# Patient Record
Sex: Female | Born: 1995 | Hispanic: No | Marital: Single | Smoking: Former smoker
Health system: Southern US, Community
[De-identification: ages and names within clinical notes are randomized; demographics above are authoritative.]

## PROBLEM LIST (undated history)

## (undated) ENCOUNTER — Inpatient Hospital Stay: Payer: Self-pay

## (undated) DIAGNOSIS — F32A Depression, unspecified: Secondary | ICD-10-CM

## (undated) DIAGNOSIS — F419 Anxiety disorder, unspecified: Secondary | ICD-10-CM

## (undated) DIAGNOSIS — F329 Major depressive disorder, single episode, unspecified: Secondary | ICD-10-CM

## (undated) DIAGNOSIS — G4730 Sleep apnea, unspecified: Secondary | ICD-10-CM

## (undated) DIAGNOSIS — O24419 Gestational diabetes mellitus in pregnancy, unspecified control: Secondary | ICD-10-CM

## (undated) HISTORY — PX: TONSILLECTOMY: SUR1361

---
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
## 2014-07-12 ENCOUNTER — Emergency Department: Payer: Self-pay | Admitting: Emergency Medicine

## 2014-07-12 LAB — URINALYSIS, COMPLETE
BACTERIA: NONE SEEN
Bilirubin,UR: NEGATIVE
Blood: NEGATIVE
Glucose,UR: NEGATIVE mg/dL (ref 0–75)
Ketone: NEGATIVE
Nitrite: NEGATIVE
PROTEIN: NEGATIVE
Ph: 8 (ref 4.5–8.0)
RBC,UR: 5 /HPF (ref 0–5)
SPECIFIC GRAVITY: 1.023 (ref 1.003–1.030)
WBC UR: 10 /HPF (ref 0–5)

## 2014-07-12 LAB — GC/CHLAMYDIA PROBE AMP

## 2014-07-12 LAB — WET PREP, GENITAL

## 2015-08-04 ENCOUNTER — Emergency Department
Admission: EM | Admit: 2015-08-04 | Discharge: 2015-08-04 | Disposition: A | Discharge status: Home / Self Care | Payer: Medicaid Other | Attending: Emergency Medicine | Admitting: Emergency Medicine

## 2015-08-04 DIAGNOSIS — G8929 Other chronic pain: Secondary | ICD-10-CM | POA: Insufficient documentation

## 2015-08-04 DIAGNOSIS — M545 Low back pain: Secondary | ICD-10-CM | POA: Diagnosis present

## 2015-08-04 DIAGNOSIS — M62830 Muscle spasm of back: Secondary | ICD-10-CM | POA: Insufficient documentation

## 2015-08-04 MED ORDER — METHOCARBAMOL 500 MG PO TABS: 500.0000 mg | ORAL_TABLET | Freq: Four times a day (QID) | ORAL | Status: DC

## 2015-08-04 MED ORDER — MELOXICAM 15 MG PO TABS: 15.0000 mg | ORAL_TABLET | Freq: Every day | ORAL | Status: DC

## 2015-08-04 NOTE — ED Notes (Signed)
Pt c/o lower back pain off and on for several months, worse in the past couple of days

## 2015-08-04 NOTE — Discharge Instructions (Signed)
Back Exercises °The following exercises strengthen the muscles that help to support the back. They also help to keep the lower back flexible. Doing these exercises can help to prevent back pain or lessen existing pain. °If you have back pain or discomfort, try doing these exercises 2-3 times each day or as told by your health care provider. When the pain goes away, do them once each day, but increase the number of times that you repeat the steps for each exercise (do more repetitions). If you do not have back pain or discomfort, do these exercises once each day or as told by your health care provider. °EXERCISES °Single Knee to Chest °Repeat these steps 3-5 times for each leg: °· Lie on your back on a firm bed or the floor with your legs extended. °· Bring one knee to your chest. Your other leg should stay extended and in contact with the floor. °· Hold your knee in place by grabbing your knee or thigh. °· Pull on your knee until you feel a gentle stretch in your lower back. °· Hold the stretch for 10-30 seconds. °· Slowly release and straighten your leg. °Pelvic Tilt °Repeat these steps 5-10 times: °· Lie on your back on a firm bed or the floor with your legs extended. °· Bend your knees so they are pointing toward the ceiling and your feet are flat on the floor. °· Tighten your lower abdominal muscles to press your lower back against the floor. This motion will tilt your pelvis so your tailbone points up toward the ceiling instead of pointing to your feet or the floor. °· With gentle tension and even breathing, hold this position for 5-10 seconds. °Cat-Cow °Repeat these steps until your lower back becomes more flexible: °· Get into a hands-and-knees position on a firm surface. Keep your hands under your shoulders, and keep your knees under your hips. You may place padding under your knees for comfort. °· Let your head hang down, and point your tailbone toward the floor so your lower back becomes rounded like the  back of a cat. °· Hold this position for 5 seconds. °· Slowly lift your head and point your tailbone up toward the ceiling so your back forms a sagging arch like the back of a cow. °· Hold this position for 5 seconds. °Press-Ups °Repeat these steps 5-10 times: °· Lie on your abdomen (face-down) on the floor. °· Place your palms near your head, about shoulder-width apart. °· While you keep your back as relaxed as possible and keep your hips on the floor, slowly straighten your arms to raise the top half of your body and lift your shoulders. Do not use your back muscles to raise your upper torso. You may adjust the placement of your hands to make yourself more comfortable. °· Hold this position for 5 seconds while you keep your back relaxed. °· Slowly return to lying flat on the floor. °Bridges °Repeat these steps 10 times: °· Lie on your back on a firm surface. °· Bend your knees so they are pointing toward the ceiling and your feet are flat on the floor. °· Tighten your buttocks muscles and lift your buttocks off of the floor until your waist is at almost the same height as your knees. You should feel the muscles working in your buttocks and the back of your thighs. If you do not feel these muscles, slide your feet 1-2 inches farther away from your buttocks. °· Hold this position for 3-5   seconds. °· Slowly lower your hips to the starting position, and allow your buttocks muscles to relax completely. °If this exercise is too easy, try doing it with your arms crossed over your chest. °Abdominal Crunches °Repeat these steps 5-10 times: °1. Lie on your back on a firm bed or the floor with your legs extended. °2. Bend your knees so they are pointing toward the ceiling and your feet are flat on the floor. °3. Cross your arms over your chest. °4. Tip your chin slightly toward your chest without bending your neck. °5. Tighten your abdominal muscles and slowly raise your trunk (torso) high enough to lift your shoulder blades  a tiny bit off of the floor. Avoid raising your torso higher than that, because it can put too much stress on your low back and it does not help to strengthen your abdominal muscles. °6. Slowly return to your starting position. °Back Lifts °Repeat these steps 5-10 times: °1. Lie on your abdomen (face-down) with your arms at your sides, and rest your forehead on the floor. °2. Tighten the muscles in your legs and your buttocks. °3. Slowly lift your chest off of the floor while you keep your hips pressed to the floor. Keep the back of your head in line with the curve in your back. Your eyes should be looking at the floor. °4. Hold this position for 3-5 seconds. °5. Slowly return to your starting position. °SEEK MEDICAL CARE IF: °· Your back pain or discomfort gets much worse when you do an exercise. °· Your back pain or discomfort does not lessen within 2 hours after you exercise. °If you have any of these problems, stop doing these exercises right away. Do not do them again unless your health care provider says that you can. °SEEK IMMEDIATE MEDICAL CARE IF: °· You develop sudden, severe back pain. If this happens, stop doing the exercises right away. Do not do them again unless your health care provider says that you can. °  °This information is not intended to replace advice given to you by your health care provider. Make sure you discuss any questions you have with your health care provider. °  °Document Released: 08/30/2004 Document Revised: 04/13/2015 Document Reviewed: 09/16/2014 °Elsevier Interactive Patient Education ©2016 [employer] ° °Heat Therapy °Heat therapy can help ease sore, stiff, injured, and tight muscles and joints. Heat relaxes your muscles, which may help ease your pain.  °RISKS AND COMPLICATIONS °If you have any of the following conditions, do not use heat therapy unless your health care provider has approved: °· Poor circulation. °· Healing wounds or scarred skin in the area being  treated. °· Diabetes, heart disease, or high blood pressure. °· Not being able to feel (numbness) the area being treated. °· Unusual swelling of the area being treated. °· Active infections. °· Blood clots. °· Cancer. °· Inability to communicate pain. This may include young children and people who have problems with their brain function (dementia). °· Pregnancy. °Heat therapy should only be used on old, pre-existing, or long-lasting (chronic) injuries. Do not use heat therapy on new injuries unless directed by your health care provider. °HOW TO USE HEAT THERAPY °There are several different kinds of heat therapy, including: °· Moist heat pack. °· Warm water bath. °· Hot water bottle. °· Electric heating pad. °· Heated gel pack. °· Heated wrap. °· Electric heating pad. °Use the heat therapy method suggested by your health care provider. Follow your health care provider's instructions on when   and how to use heat therapy. °GENERAL HEAT THERAPY RECOMMENDATIONS °· Do not sleep while using heat therapy. Only use heat therapy while you are awake. °· Your skin may turn pink while using heat therapy. Do not use heat therapy if your skin turns red. °· Do not use heat therapy if you have new pain. °· High heat or long exposure to heat can cause burns. Be careful when using heat therapy to avoid burning your skin. °· Do not use heat therapy on areas of your skin that are already irritated, such as with a rash or sunburn. °SEEK MEDICAL CARE IF: °· You have blisters, redness, swelling, or numbness. °· You have new pain. °· Your pain is worse. °MAKE SURE YOU: °· Understand these instructions. °· Will watch your condition. °· Will get help right away if you are not doing well or get worse. °  °This information is not intended to replace advice given to you by your health care provider. Make sure you discuss any questions you have with your health care provider. °  °Document Released: 10/15/2011 Document Revised: 08/13/2014 Document  Reviewed: 09/15/2013 °Elsevier Interactive Patient Education ©2016 [employer] ° °Muscle Cramps and Spasms °Muscle cramps and spasms occur when a muscle or muscles tighten and you have no control over this tightening (involuntary muscle contraction). They are a common problem and can develop in any muscle. The most common place is in the calf muscles of the leg. Both muscle cramps and muscle spasms are involuntary muscle contractions, but they also have differences:  °· Muscle cramps are sporadic and painful. They may last a few seconds to a quarter of an hour. Muscle cramps are often more forceful and last longer than muscle spasms. °· Muscle spasms may or may not be painful. They may also last just a few seconds or much longer. °CAUSES  °It is uncommon for cramps or spasms to be due to a serious underlying problem. In many cases, the cause of cramps or spasms is unknown. Some common causes are:  °· Overexertion.   °· Overuse from repetitive motions (doing the same thing over and over).   °· Remaining in a certain position for a long period of time.   °· Improper preparation, form, or technique while performing a sport or activity.   °· Dehydration.   °· Injury.   °· Side effects of some medicines.   °· Abnormally low levels of the salts and ions in your blood (electrolytes), especially potassium and calcium. This could happen if you are taking water pills (diuretics) or you are pregnant.   °Some underlying medical problems can make it more likely to develop cramps or spasms. These include, but are not limited to:  °· Diabetes.   °· Parkinson disease.   °· Hormone disorders, such as thyroid problems.   °· Alcohol abuse.   °· Diseases specific to muscles, joints, and bones.   °· Blood vessel disease where not enough blood is getting to the muscles.   °HOME CARE INSTRUCTIONS  °· Stay well hydrated. Drink enough water and fluids to keep your urine clear or pale yellow. °· It may be helpful to massage, stretch, and  relax the affected muscle. °· For tight or tense muscles, use a warm towel, heating pad, or hot shower water directed to the affected area. °· If you are sore or have pain after a cramp or spasm, applying ice to the affected area may relieve discomfort. °¨ Put ice in a plastic bag. °¨ Place a towel between your skin and the bag. °¨ Leave the ice   on for 15-20 minutes, 03-04 times a day. °· Medicines used to treat a known cause of cramps or spasms may help reduce their frequency or severity. Only take over-the-counter or prescription medicines as directed by your caregiver. °SEEK MEDICAL CARE IF:  °Your cramps or spasms get more severe, more frequent, or do not improve over time.  °MAKE SURE YOU:  °· Understand these instructions. °· Will watch your condition. °· Will get help right away if you are not doing well or get worse. °  °This information is not intended to replace advice given to you by your health care provider. Make sure you discuss any questions you have with your health care provider. °  °Document Released: 01/12/2002 Document Revised: 11/17/2012 Document Reviewed: 07/09/2012 °Elsevier Interactive Patient Education ©2016 [employer] ° °

## 2015-08-04 NOTE — ED Provider Notes (Signed)
[facility] Emergency Department Provider Note ?  ? ____________________________________________ ? Time seen: 6:36 PM ? I have reviewed the triage vital signs and the nursing notes.  ________ HISTORY ? Chief Complaint Back Pain     HPI  [name] is a 19 y.o. female   who presents emergency department for complaint of lower back pain. Patient states that she has chronic low back pain where her back will "go out." She states that this onset of symptoms has lasted for 3 days. She denies any specific injury prior to this occurrence. She denies any urinary complaints. Patient denies any radicular symptoms. Patient states that pain is worse on the right side than left. Pain is constant, tight/burning. ? ? ? History reviewed. No pertinent past medical history.  There are no active problems to display for this patient.  ? Past Surgical History  Procedure Laterality Date  . Tonsillectomy     ? Current Outpatient Rx  Name  Route  Sig  Dispense  Refill  . meloxicam (MOBIC) 15 MG tablet   Oral   Take 1 tablet (15 mg total) by mouth daily.   30 tablet   0   . methocarbamol (ROBAXIN) 500 MG tablet   Oral   Take 1 tablet (500 mg total) by mouth 4 (four) times daily.   16 tablet   0    ? Allergies Review of patient's allergies indicates no known allergies. ? No family history on file. ? Social History Social History  Substance Use Topics  . Smoking status: Never Smoker   . Smokeless tobacco: None  . Alcohol Use: No   ? Review of Systems Constitutional: no fever. Eyes: no discharge ENT: no sore throat. Cardiovascular: no chest pain. Respiratory: no cough. No sob Gastrointestinal: denies abdominal pain, vomiting, diarrhea, and constipation Genitourinary: no dysuria. Negative for hematuria Musculoskeletal: Endorses lower back pain. Skin: Negative for rash. Neurological: Negative for headaches  10-point ROS otherwise  negative.  _______________ PHYSICAL EXAM: ? VITAL SIGNS:   ED Triage Vitals  Enc Vitals Group     BP 08/04/15 1721 105/62 mmHg     Pulse Rate 08/04/15 1721 92     Resp 08/04/15 1721 20     Temp 08/04/15 1721 98 F (36.7 C)     Temp Source 08/04/15 1721 Oral     SpO2 08/04/15 1721 97 %     Weight 08/04/15 1721 238 lb (107.956 kg)     Height 08/04/15 1721 5\' 3"  (1.6 m)     Head Cir --      Peak Flow --      Pain Score 08/04/15 1721 10     Pain Loc --      Pain Edu? --      Excl. in GC? --    ?  Constitutional: Alert and oriented. Well appearing and in no distress. Eyes: Conjunctivae are normal.  ENT      Head: Normocephalic and atraumatic.      Ears:       Nose: No congestion/rhinnorhea.      Mouth/Throat: Mucous membranes are moist.   Hematological/Lymphatic/Immunilogical: No cervical lymphadenopathy. Cardiovascular: Normal rate, regular rhythm.  Respiratory: Normal respiratory effort without tachypnea nor retractions. Gastrointestinal: Soft and nontender. No distention. There is no CVA tenderness. Genitourinary:  Musculoskeletal: Nontender with normal range of motion in all extremities. No visible deformity to spine upon inspection. Patient is nontender to palpation midline. Patient is diffusely tender to palpation over the  paraspinal muscle groups bilaterally but worse on the right. No tenderness to palpation over the sciatic notches bilaterally. Negative straight leg raise bilaterally.  Neurologic:  Normal speech and language. No gross focal neurologic deficits are appreciated. Skin:  Skin is warm, dry and intact. No rash noted. Psychiatric: Mood and affect are normal. Speech and behavior are normal. Patient exhibits appropriate insight and judgment.    ___________ RADIOLOGY    _____________ PROCEDURES ? Procedure(s) performed:    Medications - No data to display  ______________________________________________________ INITIAL IMPRESSION / ASSESSMENT AND  PLAN / ED COURSE ? Pertinent labs & imaging results that were available during my care of the patient were reviewed by me and considered in my medical decision making (see chart for details).    Patient's diagnosis is consistent with lumbar paraspinal muscle spasms. Patient will be placed on anti-inflammatories and muscle relaxers for symptom control. She is advised to follow-up with orthopedics in 2-3 weeks if symptoms have not resolved. She verbalizes understanding of the diagnosis and the treatment plan and verbalizes compliance with same.    New Prescriptions   MELOXICAM (MOBIC) 15 MG TABLET    Take 1 tablet (15 mg total) by mouth daily.   METHOCARBAMOL (ROBAXIN) 500 MG TABLET    Take 1 tablet (500 mg total) by mouth 4 (four) times daily.   ____________________________________________ FINAL CLINICAL IMPRESSION(S) / ED DIAGNOSES?  Final diagnoses:  Lumbar paraspinal muscle spasm       Racheal Patches, PA-C 08/04/15 1837  Sharyn Creamer, MD 08/04/15 2052

## 2015-08-04 NOTE — ED Notes (Signed)
Lower back pain for several months  Unsure of any injury  Ambulates well to treatment area  Denies any urinary sx's

## 2015-08-07 DIAGNOSIS — O24419 Gestational diabetes mellitus in pregnancy, unspecified control: Secondary | ICD-10-CM

## 2015-08-07 HISTORY — DX: Gestational diabetes mellitus in pregnancy, unspecified control: O24.419

## 2015-09-30 ENCOUNTER — Emergency Department
Admission: EM | Admit: 2015-09-30 | Discharge: 2015-09-30 | Disposition: A | Discharge status: Home / Self Care | Payer: Medicaid Other | Attending: Emergency Medicine | Admitting: Emergency Medicine

## 2015-09-30 ENCOUNTER — Encounter: Payer: Self-pay | Admitting: Emergency Medicine

## 2015-09-30 DIAGNOSIS — Z3201 Encounter for pregnancy test, result positive: Secondary | ICD-10-CM | POA: Diagnosis not present

## 2015-09-30 DIAGNOSIS — O26811 Pregnancy related exhaustion and fatigue, first trimester: Secondary | ICD-10-CM | POA: Insufficient documentation

## 2015-09-30 DIAGNOSIS — Z791 Long term (current) use of non-steroidal anti-inflammatories (NSAID): Secondary | ICD-10-CM | POA: Diagnosis not present

## 2015-09-30 DIAGNOSIS — O210 Mild hyperemesis gravidarum: Secondary | ICD-10-CM | POA: Diagnosis present

## 2015-09-30 DIAGNOSIS — Z79899 Other long term (current) drug therapy: Secondary | ICD-10-CM | POA: Insufficient documentation

## 2015-09-30 DIAGNOSIS — Z3A10 10 weeks gestation of pregnancy: Secondary | ICD-10-CM | POA: Diagnosis not present

## 2015-09-30 LAB — URINALYSIS COMPLETE WITH MICROSCOPIC (ARMC ONLY)
BACTERIA UA: NONE SEEN
Bilirubin Urine: NEGATIVE
Glucose, UA: NEGATIVE mg/dL
Hgb urine dipstick: NEGATIVE
Leukocytes, UA: NEGATIVE
NITRITE: NEGATIVE
Protein, ur: NEGATIVE mg/dL
Specific Gravity, Urine: 1.026 (ref 1.005–1.030)
pH: 6 (ref 5.0–8.0)

## 2015-09-30 LAB — POCT PREGNANCY, URINE: PREG TEST UR: POSITIVE — AB

## 2015-09-30 MED ORDER — METOCLOPRAMIDE HCL 5 MG PO TABS: 5.0000 mg | ORAL_TABLET | Freq: Three times a day (TID) | ORAL | Status: DC | PRN

## 2015-09-30 NOTE — ED Provider Notes (Signed)
[facility] Emergency Department Provider Note ____________________________________________  Time seen: 1445  I have reviewed the triage vital signs and the nursing notes.  HISTORY  Chief Complaint  Morning Sickness  HPI [name] is a 20 y.o. female presents to the ED requesting confirmation of pregnancy following several days of what she count is morning sickness. She describes nausea and vomiting for the last week. She also notes hypersensitivity to smells that also invoke emesis. She denies any abdominal pain, diarrhea, vaginal discharge, or pelvic pain. She also denies any abnormal vaginal bleeding. She reports her LMP at mid December and is unclear the exact date. She is also noted somenipple tenderness and fatigue.   History reviewed. No pertinent past medical history.  There are no active problems to display for this patient.   Past Surgical History  Procedure Laterality Date  . Tonsillectomy      Current Outpatient Rx  Name  Route  Sig  Dispense  Refill  . meloxicam (MOBIC) 15 MG tablet   Oral   Take 1 tablet (15 mg total) by mouth daily.   30 tablet   0   . methocarbamol (ROBAXIN) 500 MG tablet   Oral   Take 1 tablet (500 mg total) by mouth 4 (four) times daily.   16 tablet   0   . metoCLOPramide (REGLAN) 5 MG tablet   Oral   Take 1 tablet (5 mg total) by mouth every 8 (eight) hours as needed for nausea or vomiting.   15 tablet   0    Allergies Review of patient's allergies indicates no known allergies.  History reviewed. No pertinent family history.  Social History Social History  Substance Use Topics  . Smoking status: Never Smoker   . Smokeless tobacco: None  . Alcohol Use: No   Review of Systems  Constitutional: Negative for fever. Reports fatigue. Eyes: Negative for visual changes. ENT: Negative for sore throat. Cardiovascular: Negative for chest pain. Respiratory: Negative for shortness of  breath. Gastrointestinal: Negative for abdominal pain and diarrhea. Reports nausea and vomiting. Genitourinary: Negative for dysuria. Musculoskeletal: Negative for back pain. Skin: Negative for rash. Neurological: Negative for headaches, focal weakness or numbness. ____________________________________________  PHYSICAL EXAM:  VITAL SIGNS: ED Triage Vitals  Enc Vitals Group     BP 09/30/15 1405 127/60 mmHg     Pulse Rate 09/30/15 1405 79     Resp 09/30/15 1405 18     Temp 09/30/15 1405 98.4 F (36.9 C)     Temp Source 09/30/15 1405 Oral     SpO2 09/30/15 1405 98 %     Weight 09/30/15 1405 241 lb (109.317 kg)     Height 09/30/15 1405  (1.626 m)     Head Cir --      Peak Flow --      Pain Score --      Pain Loc --      Pain Edu? --      Excl. in GC? --    Constitutional: Alert and oriented. Well appearing and in no distress. Head: Normocephalic and atraumatic. Eyes: Conjunctivae are normal. PERRL. Normal extraocular movements Hematological/Lymphatic/Immunological: No cervical lymphadenopathy. Cardiovascular: Normal rate, regular rhythm.  Respiratory: Normal respiratory effort. No wheezes/rales/rhonchi. Gastrointestinal: Soft and nontender. No distention. Musculoskeletal: Nontender with normal range of motion in all extremities.  Neurologic:  Normal gait without ataxia. Normal speech and language. No gross focal neurologic deficits are appreciated. Skin:  Skin is warm, dry and intact.  No rash noted. Psychiatric: Mood and affect are normal. Patient exhibits appropriate insight and judgment. ____________________________________________   LABS (pertinent positives/negatives) Labs Reviewed  URINALYSIS COMPLETEWITH MICROSCOPIC ([facility] ONLY) - Abnormal; Notable for the following:    Color, Urine AMBER (*)    APPearance HAZY (*)    Ketones, ur 1+ (*)    Squamous Epithelial / LPF 0-5 (*)    All other components within normal limits  POCT PREGNANCY, URINE - Abnormal; Notable  for the following:    Preg Test, Ur POSITIVE (*)    All other components within normal limits  POC URINE PREG, ED  ____________________________________________  PROCEDURES  Reglan 5 mg PO ____________________________________________  INITIAL IMPRESSION / ASSESSMENT AND PLAN / ED COURSE  Patient with secondary amenorrhea due to pregnancy confirmed by urine testing. Patient is discharged with a prescription for Reglan for her hyperemesis gravidarum. She will follow-up with [facility] for ongoing prenatal care. Based on her assumed LMP of 07/21/2015 (+/- 4 days) the patient is 10 weeks and 1 day gestational age with an EDC of 04/26/2016. ____________________________________________  FINAL CLINICAL IMPRESSION(S) / ED DIAGNOSES  Final diagnoses:  Pregnancy test positive      Lissa Hoard, PA-C 09/30/15 1515  Jennye Moccasin, MD 09/30/15 1553

## 2015-09-30 NOTE — ED Notes (Addendum)
Patient from home POV with complaint of morning sickness. Patient states that despite multiple negative pregnancy tests, she believes she is pregnant and is requesting an ultrasound or blood work to verify pregnancy. States she has become highly sensitive to smells. Patient is without distress in triage.

## 2015-09-30 NOTE — ED Notes (Signed)
Pt states that her lmp is in dec, states that she has been having vomiting for the past week and a half, pt concerned that she was pregnant but having negative tests at home.

## 2015-09-30 NOTE — ED Notes (Signed)
Discharge instructions reviewed with patient. Patient verbalized understanding. Patient ambulated to lobby without difficulty.   

## 2015-09-30 NOTE — Discharge Instructions (Signed)
Pregnancy, The Father's Role A father has an important role during his partner's pregnancy, labor, delivery, and after the birth of the baby. It is important to help and support your partner through this new period. There are many physical and emotional changes that happen. To be helpful and supportive during this time, you should know and understand what is happening to your partner during pregnancy, labor, delivery, and after the baby is born. WHAT ARE THE STAGES OF PREGNANCY? Pregnancy usually lasts about 40 weeks. The pregnancy is divided into three trimesters. First Trimester During the first 13 weeks, your partner may:  Feel tired.  Have painful breasts.  Feel nauseous or throw up.  Urinate more often.  Have mood changes. All of these changes are normal. If they are happening, try to be helpful, supportive, and understanding. This may include helping with household duties and activities and spending more time with each other. Second Trimester During the next 14-28 weeks:  Your partner will likely feel better and more energetic.  This is the best time of the pregnancy to be more active together.  You will be able to see her belly showing the pregnancy.  You may be able to feel the baby kick.  Your partner may have soreness or aching in her back as she gains weight. You can help her by carrying heavy things and by rubbing her back when she is feeling sore. Third Trimester During the final 12 weeks, your partner may:  Become more uncomfortable as the baby grows.  Have a hard time doing everyday activities, and her balance may be off.  Have a hard time bending over.  Tire easily.  Have difficulty sleeping. At this time, the birth of your baby is close. You and your partner may have concerns or questions. This is normal. Talk with each other and with your health care provider. Continue to help your partner with housework, encourage her to rest, and rub her sore back and  legs, if this helps her. WHAT CAN I EXPECT OR DO DURING THE PREGNANCY? You can expect to experience some changes. There are also many things you can do to help prepare you and your partner for your baby. Emotional Changes During your partner's pregnancy, emotional changes for you may include:  Having feelings of happiness, excitement, and pride.  Being concerned about having new responsibilities, such as financial or educational responsibilities.  Feeling overwhelmed or scared.  Being worried that a baby will change your relationship with your partner. These feelings are normal. Talk about them openly with your partner and your health care provider. Prenatal Care Attend prenatal care visits with your partner. This is a good time for you to get to know your health care provider, follow the pregnancy, and ask questions.  Prenatal visits usually occur one time each month for six months, then every two weeks for two months, and then one time each week during the last month. You may have more prenatal visits if your health care provider believes this is needed.  Your health care provider usually does an ultrasound of the baby at one of the prenatal visits. This may happen more often if your health care provider thinks it is needed. Sexual Activity Sexual intercourse is safe unless there is a problem with the pregnancy and your health care provider advises you to not have sexual intercourse. Because physical and emotional changes happen in pregnancy, your partner may not want to have sex during certain times. Trying different positions may  make sexual intercourse more comfortable. However, always respect your partner's decision if she does not want to have sex. It is important for both of you to discuss your feelings and desires. Talk with your health care provider about any questions that you may have about sexual intercourse during pregnancy. Childbirth Classes Attend childbirth classes with your  partner if you are able. Classes prepare you and help you to understand what happens during labor and delivery, and they help you and your partner to bond. There are even some classes that are only for new fathers. Classes also teach you and your partner:  Various relaxation techniques.  How to work with her labor pains.  How to focus during labor and delivery. WHAT SHOULD I KNOW ABOUT LABOR AND DELIVERY? Many fathers want to be present while their partner is going through labor and delivery. You may:  Be asked to time the contractions, massage your partner's back, and breathe with her during the contractions.  Get to see and enjoy the excitement of your baby being born, and you may even be able to cut your baby's umbilical cord. If you feel like you might faint or you are uncomfortable, ask someone to help you.  Need to leave the room if a problem develops during labor or delivery. A cesarean delivery, or C-section, is a procedure that may be used to deliver the baby. It is done through an incision in the abdomen and the uterus. A cesarean delivery may be scheduled or it may be an emergency procedure during labor and delivery. Most hospitals allow the father to be in the room for a cesarean delivery unless it is an emergency. Recovery from a cesarean delivery usually requires more help from the father. WHAT HAPPENS AFTER DELIVERY? After your baby is born, your partner will go through many changes again. These changes could last a few months or longer. Postpartum Depression Your partner may take awhile to regain her strength. She may also have feelings of sadness (postpartum blues or postpartum depression). If your partner is acting unusually sad or depressed, talk with your health care provider right away. This can be a serious medical condition that requires treatment. Breastfeeding Your partner may decide to breastfeed the baby. This helps with bonding between the mother and the baby, and  breast milk is the best nutrition for your baby. You can feel included by burping the baby and bottle-feeding the baby with breast milk that was collected from the mother. This allows your partner to rest and helps you to bond with your baby. Sexual Activity It may take a few months for your partner's body to heal and be ready for sexual intercourse again. This may take longer after a cesarean delivery. If you have any questions about having sexual intercourse or if it is painful for your partner, talk with your health care provider. It is possible for breastfeeding mothers to become pregnant even if they are not having menstrual periods. Use birth control (contraception) unless you and your partner would like to become pregnant again. WHAT SHOULD I REMEMBER? Fatherhood and having a baby is an ongoing learning experience. It is common to be anxious, concerned, or afraid that you may not be taking care of your newborn baby properly. It is important to talk with your partner and your health care provider if you are worried or have any questions.   This information is not intended to replace advice given to you by your health care provider. Make sure you  discuss any questions you have with your health care provider.   Document Released: 01/09/2008 Document Revised: 08/13/2014 Document Reviewed: 04/09/2014 Elsevier Interactive Patient Education 2016 [employer].  Prenatal Care WHAT IS PRENATAL CARE?  Prenatal care is the process of caring for a pregnant woman before she gives birth. Prenatal care makes sure that she and her baby remain as healthy as possible throughout pregnancy. Prenatal care may be provided by a midwife, family practice health care provider, or a childbirth and pregnancy specialist (obstetrician). Prenatal care may include physical examinations, testing, treatments, and education on nutrition, lifestyle, and social support services. WHY IS PRENATAL CARE SO IMPORTANT?  Early and  consistent prenatal care increases the chance that you and your baby will remain healthy throughout your pregnancy. This type of care also decreases a baby's risk of being born too early (prematurely), or being born smaller than expected (small for gestational age). Any underlying medical conditions you may have that could pose a risk during your pregnancy are discussed during prenatal care visits. You will also be monitored regularly for any new conditions that may arise during your pregnancy so they can be treated quickly and effectively. WHAT HAPPENS DURING PRENATAL CARE VISITS? Prenatal care visits may include the following: Discussion Tell your health care provider about any new signs or symptoms you have experienced since your last visit. These might include:  Nausea or vomiting.  Increased or decreased level of energy.  Difficulty sleeping.  Back or leg pain.  Weight changes.  Frequent urination.  Shortness of breath with physical activity.  Changes in your skin, such as the development of a rash or itchiness.  Vaginal discharge or bleeding.  Feelings of excitement or nervousness.  Changes in your baby's movements. You may want to write down any questions or topics you want to discuss with your health care provider and bring them with you to your appointment. Examination During your first prenatal care visit, you will likely have a complete physical exam. Your health care provider will often examine your vagina, cervix, and the position of your uterus, as well as check your heart, lungs, and other body systems. As your pregnancy progresses, your health care provider will measure the size of your uterus and your baby's position inside your uterus. He or she may also examine you for early signs of labor. Your prenatal visits may also include checking your blood pressure and, after about 10-12 weeks of pregnancy, listening to your baby's heartbeat. Testing Regular testing often  includes:  Urinalysis. This checks your urine for glucose, protein, or signs of infection.  Blood count. This checks the levels of white and red blood cells in your body.  Tests for sexually transmitted infections (STIs). Testing for STIs at the beginning of pregnancy is routinely done and is required in many states.  Antibody testing. You will be checked to see if you are immune to certain illnesses, such as rubella, that can affect a developing fetus.  Glucose screen. Around 24-28 weeks of pregnancy, your blood glucose level will be checked for signs of gestational diabetes. Follow-up tests may be recommended.  Group B strep. This is a bacteria that is commonly found inside a woman's vagina. This test will inform your health care provider if you need an antibiotic to reduce the amount of this bacteria in your body prior to labor and childbirth.  Ultrasound. Many pregnant women undergo an ultrasound screening around 18-20 weeks of pregnancy to evaluate the health of the fetus and check  for any developmental abnormalities.  HIV (human immunodeficiency virus) testing. Early in your pregnancy, you will be screened for HIV. If you are at high risk for HIV, this test may be repeated during your third trimester of pregnancy. You may be offered other testing based on your age, personal or family medical history, or other factors.  HOW OFTEN SHOULD I PLAN TO SEE MY HEALTH CARE PROVIDER FOR PRENATAL CARE? Your prenatal care check-up schedule depends on any medical conditions you have before, or develop during, your pregnancy. If you do not have any underlying medical conditions, you will likely be seen for checkups:  Monthly, during the first 6 months of pregnancy.  Twice a month during months 7 and 8 of pregnancy.  Weekly starting in the 9th month of pregnancy and until delivery. If you develop signs of early labor or other concerning signs or symptoms, you may need to see your health care  provider more often. Ask your health care provider what prenatal care schedule is best for you. WHAT CAN I DO TO KEEP MYSELF AND MY BABY AS HEALTHY AS POSSIBLE DURING MY PREGNANCY?  Take a prenatal vitamin containing 400 micrograms (0.4 mg) of folic acid every day. Your health care provider may also ask you to take additional vitamins such as iodine, vitamin D, iron, copper, and zinc.  Take 1500-2000 mg of calcium daily starting at your 20th week of pregnancy until you deliver your baby.  Make sure you are up to date on your vaccinations. Unless directed otherwise by your health care provider:  You should receive a tetanus, diphtheria, and pertussis (Tdap) vaccination between the 27th and 36th week of your pregnancy, regardless of when your last Tdap immunization occurred. This helps protect your baby from whooping cough (pertussis) after he or she is born.  You should receive an annual inactivated influenza vaccine (IIV) to help protect you and your baby from influenza. This can be done at any point during your pregnancy.  Eat a well-rounded diet that includes:  Fresh fruits and vegetables.  Lean proteins.  Calcium-rich foods such as milk, yogurt, hard cheeses, and dark, leafy greens.  Whole grain breads.  Do noteat seafood high in mercury, including:  Swordfish.  Tilefish.  Shark.  King mackerel.  More than 6 oz tuna per week.  Do not eat:  Raw or undercooked meats or eggs.  Unpasteurized foods, such as soft cheeses (brie, blue, or feta), juices, and milks.  Lunch meats.  Hot dogs that have not been heated until they are steaming.  Drink enough water to keep your urine clear or pale yellow. For many women, this may be 10 or more 8 oz glasses of water each day. Keeping yourself hydrated helps deliver nutrients to your baby and may prevent the start of pre-term uterine contractions.  Do not use any tobacco products including cigarettes, chewing tobacco, or electronic  cigarettes. If you need help quitting, ask your health care provider.  Do not drink beverages containing alcohol. No safe level of alcohol consumption during pregnancy has been determined.  Do not use any illegal drugs. These can harm your developing baby or cause a miscarriage.  Ask your health care provider or pharmacist before taking any prescription or over-the-counter medicines, herbs, or supplements.  Limit your caffeine intake to no more than 200 mg per day.  Exercise. Unless told otherwise by your health care provider, try to get 30 minutes of moderate exercise most days of the week. Do not  do  high-impact activities, contact sports, or activities with a high risk of falling, such as horseback riding or downhill skiing.  Get plenty of rest.  Avoid anything that raises your body temperature, such as hot tubs and saunas.  If you own a cat, do not empty its litter box. Bacteria contained in cat feces can cause an infection called toxoplasmosis. This can result in serious harm to the fetus.  Stay away from chemicals such as insecticides, lead, mercury, and cleaning or paint products that contain solvents.  Do not have any X-rays taken unless medically necessary.  Take a childbirth and breastfeeding preparation class. Ask your health care provider if you need a referral or recommendation.   This information is not intended to replace advice given to you by your health care provider. Make sure you discuss any questions you have with your health care provider.   Document Released: 07/26/2003 Document Revised: 08/13/2014 Document Reviewed: 10/07/2013 Elsevier Interactive Patient Education 2016 [employer].   Pregnancy Test Information WHAT IS A PREGNANCY TEST? A pregnancy test is used to detect the presence of human chorionic gonadotropin (hCG) in a sample of your urine or blood. hCG is a hormone produced by the cells of the placenta. The placenta is the organ that forms to nourish  and support a developing baby. This test requires a sample of either blood or urine. A pregnancy test determines whether you are pregnant or not. HOW ARE PREGNANCY TESTS DONE? Pregnancy tests are done using a home pregnancy test or having a blood or urine test done at your health care provider's office.  Home pregnancy tests require a urine sample.  Most kits use a plastic testing device with a strip of paper that indicates whether there is hCG in your urine.  Follow the test instructions very carefully.  After you urinate on the test stick, markings will appear to let you know whether you are pregnant.  For best results, use your first urine of the morning. That is when the concentration of hCG is highest. Having a blood test to check for pregnancy requires a sample of blood drawn from a vein in your hand or arm. Your health care provider will send your sample to a lab for testing. Results of a pregnancy test will be positive or negative. IS ONE TYPE OF PREGNANCY TEST BETTER THAN ANOTHER? In some cases, a blood test will return a positive result even if a urine test was negative because blood tests are more sensitive. This means blood tests can detect hCG earlier than home pregnancy tests.  HOW ACCURATE ARE HOME PREGNANCY TESTS?  Both types of pregnancy tests are very accurate.  A blood test is about 98% accurate.  When you are far enough along in your pregnancy and when used correctly, home pregnancy tests are equally accurate. CAN ANYTHING INTERFERE WITH HOME PREGNANCY TEST RESULTS?  It is possible for certain conditions to cause an inaccurate test result (false positive or falsenegative).  A false positive is a positive test result when you are not pregnant. This can happen if you:  Are taking certain medicines, including anticonvulsants or tranquilizers.  Have certain proteins in your blood.  A false negative is a negative test result when you are pregnant. This can happen if  you:  Took the test before there was enough hCG to detect. A pregnancy test will not be positive in most women until 3-4 weeks after conception.  Drank a lot of liquid before the test. Diluted urine samples  can sometimes give an inaccurate result.  Take certain medicines, such as water pills (diuretics) or some antihistamines. WHAT SHOULD I DO IF I HAVE A POSITIVE PREGNANCY TEST? If you have a positive pregnancy test, schedule an appointment with your health care provider. You might need additional testing to confirm the pregnancy. In the meantime, begin taking a prenatal vitamin, stop smoking, stop drinking alcohol, and do not use street drugs. Talk to your health care provider about how to take care of yourself during your pregnancy. Ask about what to expect from the care you will need throughout pregnancy (prenatal care).   This information is not intended to replace advice given to you by your health care provider. Make sure you discuss any questions you have with your health care provider.   Document Released: 07/26/2003 Document Revised: 08/13/2014 Document Reviewed: 11/17/2013 Elsevier Interactive Patient Education 2016 [employer].  Congratulations! Your urine pregnancy test was positive today. You should continue taking your prenatal vitamins for the duration of this pregnancy. Arrange for follow-up with the [facility] for routine prenatal care. Take the nausea medicine as needed for morning sickness. Return to the ED for any abnormal vaginal bleeding or pelvic pain.

## 2015-10-04 ENCOUNTER — Other Ambulatory Visit: Payer: Self-pay | Admitting: Family Medicine

## 2015-10-04 DIAGNOSIS — Z3401 Encounter for supervision of normal first pregnancy, first trimester: Secondary | ICD-10-CM

## 2015-10-05 LAB — OB RESULTS CONSOLE RUBELLA ANTIBODY, IGM: Rubella: IMMUNE

## 2015-10-05 LAB — OB RESULTS CONSOLE HEPATITIS B SURFACE ANTIGEN: HEP B S AG: NEGATIVE

## 2015-10-05 LAB — OB RESULTS CONSOLE VARICELLA ZOSTER ANTIBODY, IGG: VARICELLA IGG: NON-IMMUNE/NOT IMMUNE

## 2015-10-14 ENCOUNTER — Ambulatory Visit: Payer: Medicaid Other

## 2015-10-19 ENCOUNTER — Ambulatory Visit
Admission: RE | Admit: 2015-10-19 | Discharge: 2015-10-19 | Disposition: A | Discharge status: Home / Self Care | Payer: Medicaid Other | Source: Ambulatory Visit | Attending: Family Medicine | Admitting: Family Medicine

## 2015-10-19 DIAGNOSIS — Z3A09 9 weeks gestation of pregnancy: Secondary | ICD-10-CM | POA: Insufficient documentation

## 2015-10-19 DIAGNOSIS — Z3401 Encounter for supervision of normal first pregnancy, first trimester: Secondary | ICD-10-CM | POA: Insufficient documentation

## 2015-11-23 LAB — OB RESULTS CONSOLE GC/CHLAMYDIA
CHLAMYDIA, DNA PROBE: NEGATIVE
Gonorrhea: NEGATIVE

## 2015-12-22 ENCOUNTER — Other Ambulatory Visit: Payer: Self-pay | Admitting: Family Medicine

## 2015-12-22 DIAGNOSIS — Z3402 Encounter for supervision of normal first pregnancy, second trimester: Secondary | ICD-10-CM

## 2015-12-29 ENCOUNTER — Other Ambulatory Visit: Payer: Self-pay | Admitting: Family Medicine

## 2015-12-29 DIAGNOSIS — Z3402 Encounter for supervision of normal first pregnancy, second trimester: Secondary | ICD-10-CM

## 2015-12-29 DIAGNOSIS — Z3689 Encounter for other specified antenatal screening: Secondary | ICD-10-CM

## 2015-12-30 ENCOUNTER — Ambulatory Visit
Admission: RE | Admit: 2015-12-30 | Discharge: 2015-12-30 | Disposition: A | Discharge status: Home / Self Care | Payer: Medicaid Other | Source: Ambulatory Visit | Attending: Family Medicine | Admitting: Family Medicine

## 2015-12-30 DIAGNOSIS — Z3402 Encounter for supervision of normal first pregnancy, second trimester: Secondary | ICD-10-CM | POA: Diagnosis present

## 2015-12-30 DIAGNOSIS — Z3689 Encounter for other specified antenatal screening: Secondary | ICD-10-CM

## 2015-12-30 DIAGNOSIS — Z3A20 20 weeks gestation of pregnancy: Secondary | ICD-10-CM | POA: Insufficient documentation

## 2016-02-18 LAB — OB RESULTS CONSOLE HIV ANTIBODY (ROUTINE TESTING): HIV: NONREACTIVE

## 2016-02-18 LAB — OB RESULTS CONSOLE RPR: RPR: NONREACTIVE

## 2016-02-20 ENCOUNTER — Other Ambulatory Visit: Payer: Self-pay | Admitting: Family Medicine

## 2016-02-20 DIAGNOSIS — Z3402 Encounter for supervision of normal first pregnancy, second trimester: Secondary | ICD-10-CM

## 2016-02-28 ENCOUNTER — Ambulatory Visit: Payer: Medicaid Other

## 2016-03-14 ENCOUNTER — Other Ambulatory Visit: Payer: Self-pay | Admitting: Family Medicine

## 2016-03-14 ENCOUNTER — Inpatient Hospital Stay
Admission: EM | Admit: 2016-03-14 | Discharge: 2016-03-14 | Disposition: A | Discharge status: Home / Self Care | Payer: Medicaid Other | Attending: Obstetrics and Gynecology | Admitting: Obstetrics and Gynecology

## 2016-03-14 ENCOUNTER — Ambulatory Visit
Admission: RE | Admit: 2016-03-14 | Discharge: 2016-03-14 | Disposition: A | Discharge status: Home / Self Care | Payer: Medicaid Other | Source: Ambulatory Visit | Attending: Family Medicine | Admitting: Family Medicine

## 2016-03-14 DIAGNOSIS — O321XX0 Maternal care for breech presentation, not applicable or unspecified: Secondary | ICD-10-CM | POA: Diagnosis not present

## 2016-03-14 DIAGNOSIS — Z3A30 30 weeks gestation of pregnancy: Secondary | ICD-10-CM | POA: Diagnosis not present

## 2016-03-14 DIAGNOSIS — Z3403 Encounter for supervision of normal first pregnancy, third trimester: Secondary | ICD-10-CM | POA: Diagnosis present

## 2016-03-14 DIAGNOSIS — Z3402 Encounter for supervision of normal first pregnancy, second trimester: Secondary | ICD-10-CM

## 2016-03-14 NOTE — OB Triage Note (Signed)
Ms. [name] sent here from [facility] today for low AFI, monitoring. Pt denies any LOF. Reports that she has GDM.

## 2016-03-14 NOTE — OB Triage Provider Note (Signed)
   Triage visit for NST   [name] is a 20 y.o. G1P0. She is at 2123w2d gestation. She presents for an NST after concern at growth ultrasound for low amniotic fluid. However, her AFI is 7.8cm, which is normal. The baby is breech.  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  S: Resting comfortably. no CTX, no VB. Active fetal movement.  O:  BP 129/72 (BP Location: Left Arm)   Pulse 97   Temp 98.7 F (37.1 C) (Oral)   Resp 16   LMP 07/21/2015  No results found for this or any previous visit (from the past 48 hour(s)).   Gen: NAD, AAOx3      Abd: FNTTP      Ext: Non-tender, Nonedmeatous    FHT: 130, mod var, +accels, no decels (15x15) TOCO: quiet SVE:  deferred   A/P:  20 y.o. G1P0 5323w2d with gDMA1 and obesity, low normal AFI.    Reactive NST, with moderate variability and accelerations, no decels  Fetal Wellbeing: Reassuring  Repeat AFI in 1 week  Fetal kick counts reviewed  D/c home stable, precautions reviewed, follow-up as scheduled.

## 2016-03-22 ENCOUNTER — Ambulatory Visit: Payer: Medicaid Other

## 2016-03-29 ENCOUNTER — Ambulatory Visit
Admission: RE | Admit: 2016-03-29 | Discharge: 2016-03-29 | Disposition: A | Discharge status: Home / Self Care | Payer: Medicaid Other | Source: Ambulatory Visit | Attending: Obstetrics & Gynecology | Admitting: Obstetrics & Gynecology

## 2016-03-29 DIAGNOSIS — Z3A32 32 weeks gestation of pregnancy: Secondary | ICD-10-CM | POA: Insufficient documentation

## 2016-03-29 DIAGNOSIS — O321XX0 Maternal care for breech presentation, not applicable or unspecified: Secondary | ICD-10-CM | POA: Insufficient documentation

## 2016-03-29 DIAGNOSIS — O24913 Unspecified diabetes mellitus in pregnancy, third trimester: Secondary | ICD-10-CM | POA: Diagnosis not present

## 2016-03-29 DIAGNOSIS — O24419 Gestational diabetes mellitus in pregnancy, unspecified control: Secondary | ICD-10-CM

## 2016-04-26 ENCOUNTER — Other Ambulatory Visit: Payer: Self-pay

## 2016-04-26 ENCOUNTER — Encounter: Payer: Self-pay | Admitting: *Deleted

## 2016-04-26 ENCOUNTER — Inpatient Hospital Stay
Admission: RE | Admit: 2016-04-26 | Discharge: 2016-04-29 | DRG: 765 | Disposition: A | Discharge status: Home / Self Care | Payer: Medicaid Other | Attending: Obstetrics and Gynecology | Admitting: Obstetrics and Gynecology

## 2016-04-26 ENCOUNTER — Inpatient Hospital Stay: Payer: Medicaid Other | Admitting: Anesthesiology

## 2016-04-26 ENCOUNTER — Ambulatory Visit
Admission: RE | Admit: 2016-04-26 | Discharge: 2016-04-26 | Disposition: A | Discharge status: Home / Self Care | Payer: Medicaid Other | Source: Ambulatory Visit | Attending: Maternal & Fetal Medicine | Admitting: Maternal & Fetal Medicine

## 2016-04-26 ENCOUNTER — Encounter
Admission: RE | Disposition: A | Discharge status: Home / Self Care | Payer: Self-pay | Source: Home / Self Care | Attending: Obstetrics and Gynecology

## 2016-04-26 DIAGNOSIS — Z6841 Body Mass Index (BMI) 40.0 and over, adult: Secondary | ICD-10-CM

## 2016-04-26 DIAGNOSIS — O321XX0 Maternal care for breech presentation, not applicable or unspecified: Secondary | ICD-10-CM | POA: Diagnosis present

## 2016-04-26 DIAGNOSIS — Z3A36 36 weeks gestation of pregnancy: Secondary | ICD-10-CM

## 2016-04-26 DIAGNOSIS — O24420 Gestational diabetes mellitus in childbirth, diet controlled: Secondary | ICD-10-CM | POA: Diagnosis present

## 2016-04-26 DIAGNOSIS — Z87891 Personal history of nicotine dependence: Secondary | ICD-10-CM

## 2016-04-26 DIAGNOSIS — O4100X0 Oligohydramnios, unspecified trimester, not applicable or unspecified: Secondary | ICD-10-CM | POA: Diagnosis present

## 2016-04-26 DIAGNOSIS — O24410 Gestational diabetes mellitus in pregnancy, diet controlled: Secondary | ICD-10-CM

## 2016-04-26 DIAGNOSIS — Z9889 Other specified postprocedural states: Secondary | ICD-10-CM | POA: Diagnosis present

## 2016-04-26 DIAGNOSIS — O99214 Obesity complicating childbirth: Secondary | ICD-10-CM | POA: Diagnosis present

## 2016-04-26 HISTORY — DX: Anxiety disorder, unspecified: F41.9

## 2016-04-26 HISTORY — DX: Depression, unspecified: F32.A

## 2016-04-26 HISTORY — DX: Sleep apnea, unspecified: G47.30

## 2016-04-26 HISTORY — DX: Gestational diabetes mellitus in pregnancy, unspecified control: O24.419

## 2016-04-26 HISTORY — DX: Major depressive disorder, single episode, unspecified: F32.9

## 2016-04-26 LAB — URINE DRUG SCREEN, QUALITATIVE (ARMC ONLY)
AMPHETAMINES, UR SCREEN: NOT DETECTED
Barbiturates, Ur Screen: NOT DETECTED
Benzodiazepine, Ur Scrn: NOT DETECTED
CANNABINOID 50 NG, UR ~~LOC~~: NOT DETECTED
COCAINE METABOLITE, UR ~~LOC~~: NOT DETECTED
MDMA (ECSTASY) UR SCREEN: NOT DETECTED
Methadone Scn, Ur: NOT DETECTED
Opiate, Ur Screen: NOT DETECTED
Phencyclidine (PCP) Ur S: NOT DETECTED
TRICYCLIC, UR SCREEN: NOT DETECTED

## 2016-04-26 LAB — CBC
HEMATOCRIT: 36.4 % (ref 35.0–47.0)
Hemoglobin: 12.6 g/dL (ref 12.0–16.0)
MCH: 28.6 pg (ref 26.0–34.0)
MCHC: 34.5 g/dL (ref 32.0–36.0)
MCV: 83 fL (ref 80.0–100.0)
PLATELETS: 291 10*3/uL (ref 150–440)
RBC: 4.39 MIL/uL (ref 3.80–5.20)
RDW: 13.9 % (ref 11.5–14.5)
WBC: 14 10*3/uL — ABNORMAL HIGH (ref 3.6–11.0)

## 2016-04-26 LAB — TYPE AND SCREEN
ABO/RH(D): O POS
Antibody Screen: NEGATIVE

## 2016-04-26 LAB — CHLAMYDIA/NGC RT PCR (ARMC ONLY)
CHLAMYDIA TR: NOT DETECTED
N gonorrhoeae: NOT DETECTED

## 2016-04-26 LAB — GLUCOSE, CAPILLARY: GLUCOSE-CAPILLARY: 69 mg/dL (ref 65–99)

## 2016-04-26 SURGERY — Surgical Case
Anesthesia: Spinal | Site: Abdomen | Wound class: Clean Contaminated

## 2016-04-26 MED ORDER — KETOROLAC TROMETHAMINE 30 MG/ML IJ SOLN
INTRAMUSCULAR | Status: AC
  Administered 2016-04-26: 30 mg via INTRAVENOUS
  Filled 2016-04-26: qty 1

## 2016-04-26 MED ORDER — ZOLPIDEM TARTRATE 5 MG PO TABS: 5.0000 mg | ORAL_TABLET | Freq: Every evening | ORAL | Status: DC | PRN

## 2016-04-26 MED ORDER — LACTATED RINGERS IV SOLN: INTRAVENOUS | Status: DC

## 2016-04-26 MED ORDER — LACTATED RINGERS IV SOLN
INTRAVENOUS | Status: DC
  Administered 2016-04-26 (×2): via INTRAVENOUS

## 2016-04-26 MED ORDER — FAMOTIDINE 20 MG PO TABS
20.0000 mg | ORAL_TABLET | Freq: Once | ORAL | Status: DC
  Filled 2016-04-26: qty 1

## 2016-04-26 MED ORDER — SCOPOLAMINE 1 MG/3DAYS TD PT72: 1.0000 | MEDICATED_PATCH | Freq: Once | TRANSDERMAL | Status: DC

## 2016-04-26 MED ORDER — LACTATED RINGERS IV SOLN
INTRAVENOUS | Status: DC
  Administered 2016-04-26 – 2016-04-27 (×3): via INTRAVENOUS

## 2016-04-26 MED ORDER — ONDANSETRON HCL 4 MG/2ML IJ SOLN: 4.0000 mg | Freq: Four times a day (QID) | INTRAMUSCULAR | Status: DC | PRN

## 2016-04-26 MED ORDER — DIBUCAINE 1 % RE OINT: 1.0000 "application " | TOPICAL_OINTMENT | RECTAL | Status: DC | PRN

## 2016-04-26 MED ORDER — MEPERIDINE HCL 25 MG/ML IJ SOLN: 6.2500 mg | INTRAMUSCULAR | Status: DC | PRN

## 2016-04-26 MED ORDER — IBUPROFEN 600 MG PO TABS
600.0000 mg | ORAL_TABLET | Freq: Four times a day (QID) | ORAL | Status: DC
  Administered 2016-04-27 – 2016-04-29 (×7): 600 mg via ORAL
  Filled 2016-04-26 (×7): qty 1

## 2016-04-26 MED ORDER — BUPIVACAINE 0.25 % ON-Q PUMP DUAL CATH 400 ML
INJECTION | Status: AC
  Filled 2016-04-26: qty 400

## 2016-04-26 MED ORDER — OXYCODONE-ACETAMINOPHEN 5-325 MG PO TABS
2.0000 | ORAL_TABLET | ORAL | Status: DC | PRN
  Administered 2016-04-27 – 2016-04-29 (×7): 2 via ORAL
  Filled 2016-04-26 (×7): qty 2

## 2016-04-26 MED ORDER — ONDANSETRON HCL 4 MG/2ML IJ SOLN: 4.0000 mg | Freq: Three times a day (TID) | INTRAMUSCULAR | Status: DC | PRN

## 2016-04-26 MED ORDER — DIPHENHYDRAMINE HCL 50 MG/ML IJ SOLN: 12.5000 mg | INTRAMUSCULAR | Status: DC | PRN

## 2016-04-26 MED ORDER — BUPIVACAINE HCL (PF) 0.5 % IJ SOLN
INTRAMUSCULAR | Status: DC | PRN
Start: 2016-04-26 — End: 2016-04-26
  Administered 2016-04-26: 30 mL

## 2016-04-26 MED ORDER — EPHEDRINE SULFATE-NACL 50-0.9 MG/10ML-% IV SOSY
PREFILLED_SYRINGE | INTRAVENOUS | Status: DC | PRN
  Administered 2016-04-26: 10 mg via INTRAVENOUS

## 2016-04-26 MED ORDER — TETANUS-DIPHTH-ACELL PERTUSSIS 5-2.5-18.5 LF-MCG/0.5 IM SUSP: 0.5000 mL | Freq: Once | INTRAMUSCULAR | Status: DC

## 2016-04-26 MED ORDER — METHYLERGONOVINE MALEATE 0.2 MG/ML IJ SOLN
INTRAMUSCULAR | Status: DC | PRN
Start: 2016-04-26 — End: 2016-04-26
  Administered 2016-04-26: 0.2 mg via INTRAMUSCULAR

## 2016-04-26 MED ORDER — DIPHENHYDRAMINE HCL 25 MG PO CAPS: 25.0000 mg | ORAL_CAPSULE | Freq: Four times a day (QID) | ORAL | Status: DC | PRN

## 2016-04-26 MED ORDER — OXYCODONE HCL 5 MG PO TABS
5.0000 mg | ORAL_TABLET | ORAL | Status: DC | PRN
  Administered 2016-04-28: 5 mg via ORAL
  Filled 2016-04-26: qty 1

## 2016-04-26 MED ORDER — PRENATAL MULTIVITAMIN CH
1.0000 | ORAL_TABLET | Freq: Every day | ORAL | Status: DC
  Administered 2016-04-27: 1 via ORAL
  Filled 2016-04-26 (×2): qty 1

## 2016-04-26 MED ORDER — ACETAMINOPHEN 325 MG PO TABS
ORAL_TABLET | ORAL | Status: AC
  Administered 2016-04-26: 650 mg via ORAL
  Filled 2016-04-26: qty 2

## 2016-04-26 MED ORDER — OXYTOCIN 40 UNITS IN LACTATED RINGERS INFUSION - SIMPLE MED
2.5000 [IU]/h | INTRAVENOUS | Status: AC
  Filled 2016-04-26: qty 1000

## 2016-04-26 MED ORDER — OXYCODONE-ACETAMINOPHEN 5-325 MG PO TABS: 1.0000 | ORAL_TABLET | ORAL | Status: DC | PRN

## 2016-04-26 MED ORDER — SENNOSIDES-DOCUSATE SODIUM 8.6-50 MG PO TABS
2.0000 | ORAL_TABLET | ORAL | Status: DC
  Administered 2016-04-28: 2 via ORAL
  Filled 2016-04-26: qty 2

## 2016-04-26 MED ORDER — NALBUPHINE HCL 10 MG/ML IJ SOLN: 5.0000 mg | INTRAMUSCULAR | Status: DC | PRN

## 2016-04-26 MED ORDER — NALBUPHINE HCL 10 MG/ML IJ SOLN: 5.0000 mg | Freq: Once | INTRAMUSCULAR | Status: DC | PRN

## 2016-04-26 MED ORDER — METHYLERGONOVINE MALEATE 0.2 MG/ML IJ SOLN
INTRAMUSCULAR | Status: AC
  Filled 2016-04-26: qty 1

## 2016-04-26 MED ORDER — NALOXONE HCL 0.4 MG/ML IJ SOLN
0.4000 mg | INTRAMUSCULAR | Status: DC | PRN
Start: 2016-04-26 — End: 2016-04-29

## 2016-04-26 MED ORDER — SIMETHICONE 80 MG PO CHEW
80.0000 mg | CHEWABLE_TABLET | Freq: Three times a day (TID) | ORAL | Status: DC
  Administered 2016-04-27 – 2016-04-29 (×7): 80 mg via ORAL
  Filled 2016-04-26 (×7): qty 1

## 2016-04-26 MED ORDER — COCONUT OIL OIL: 1.0000 "application " | TOPICAL_OIL | Status: DC | PRN

## 2016-04-26 MED ORDER — OXYCODONE-ACETAMINOPHEN 5-325 MG PO TABS: 2.0000 | ORAL_TABLET | ORAL | Status: DC | PRN

## 2016-04-26 MED ORDER — LIDOCAINE HCL (PF) 1 % IJ SOLN: 30.0000 mL | INTRAMUSCULAR | Status: DC | PRN

## 2016-04-26 MED ORDER — KETOROLAC TROMETHAMINE 30 MG/ML IJ SOLN
30.0000 mg | Freq: Four times a day (QID) | INTRAMUSCULAR | Status: AC
  Administered 2016-04-26 – 2016-04-27 (×4): 30 mg via INTRAVENOUS
  Filled 2016-04-26 (×3): qty 1

## 2016-04-26 MED ORDER — BUPIVACAINE IN DEXTROSE 0.75-8.25 % IT SOLN
INTRATHECAL | Status: DC | PRN
Start: 2016-04-26 — End: 2016-04-26
  Administered 2016-04-26: 1.6 mL via INTRATHECAL

## 2016-04-26 MED ORDER — SIMETHICONE 80 MG PO CHEW: 80.0000 mg | CHEWABLE_TABLET | ORAL | Status: DC | PRN

## 2016-04-26 MED ORDER — SOD CITRATE-CITRIC ACID 500-334 MG/5ML PO SOLN
30.0000 mL | ORAL | Status: DC | PRN
  Filled 2016-04-26: qty 15

## 2016-04-26 MED ORDER — SODIUM CHLORIDE 0.9% FLUSH: 3.0000 mL | INTRAVENOUS | Status: DC | PRN

## 2016-04-26 MED ORDER — MORPHINE SULFATE (PF) 0.5 MG/ML IJ SOLN
INTRAMUSCULAR | Status: DC | PRN
Start: 2016-04-26 — End: 2016-04-26
  Administered 2016-04-26: .1 mg via INTRATHECAL

## 2016-04-26 MED ORDER — OXYTOCIN BOLUS FROM INFUSION: 500.0000 mL | Freq: Once | INTRAVENOUS | Status: DC

## 2016-04-26 MED ORDER — PHENYLEPHRINE HCL 10 MG/ML IJ SOLN
INTRAMUSCULAR | Status: DC | PRN
  Administered 2016-04-26 (×6): 100 ug via INTRAVENOUS

## 2016-04-26 MED ORDER — KETOROLAC TROMETHAMINE 30 MG/ML IJ SOLN: 30.0000 mg | Freq: Four times a day (QID) | INTRAMUSCULAR | Status: AC

## 2016-04-26 MED ORDER — WITCH HAZEL-GLYCERIN EX PADS: 1.0000 "application " | MEDICATED_PAD | CUTANEOUS | Status: DC | PRN

## 2016-04-26 MED ORDER — SOD CITRATE-CITRIC ACID 500-334 MG/5ML PO SOLN
30.0000 mL | Freq: Once | ORAL | Status: AC
  Administered 2016-04-26: 30 mL via ORAL

## 2016-04-26 MED ORDER — MENTHOL 3 MG MT LOZG
1.0000 | LOZENGE | OROMUCOSAL | Status: DC | PRN
  Filled 2016-04-26: qty 9

## 2016-04-26 MED ORDER — IBUPROFEN 600 MG PO TABS: 600.0000 mg | ORAL_TABLET | Freq: Four times a day (QID) | ORAL | Status: DC

## 2016-04-26 MED ORDER — PENICILLIN G POTASSIUM 5000000 UNITS IJ SOLR
5.0000 10*6.[IU] | Freq: Once | INTRAVENOUS | Status: DC
  Filled 2016-04-26: qty 5

## 2016-04-26 MED ORDER — ACETAMINOPHEN 325 MG PO TABS: 650.0000 mg | ORAL_TABLET | ORAL | Status: DC | PRN

## 2016-04-26 MED ORDER — CEFAZOLIN SODIUM-DEXTROSE 2-4 GM/100ML-% IV SOLN
2.0000 g | Freq: Once | INTRAVENOUS | Status: AC
  Administered 2016-04-26: 2 g via INTRAVENOUS
  Filled 2016-04-26: qty 100

## 2016-04-26 MED ORDER — NALOXONE HCL 2 MG/2ML IJ SOSY
1.0000 ug/kg/h | PREFILLED_SYRINGE | INTRAVENOUS | Status: DC | PRN
  Filled 2016-04-26: qty 2

## 2016-04-26 MED ORDER — SIMETHICONE 80 MG PO CHEW
80.0000 mg | CHEWABLE_TABLET | ORAL | Status: DC
  Filled 2016-04-26: qty 1

## 2016-04-26 MED ORDER — BUPIVACAINE 0.25 % ON-Q PUMP DUAL CATH 400 ML
400.0000 mL | INJECTION | Status: DC
Start: 2016-04-26 — End: 2016-04-29

## 2016-04-26 MED ORDER — FENTANYL CITRATE (PF) 100 MCG/2ML IJ SOLN
INTRAMUSCULAR | Status: DC | PRN
  Administered 2016-04-26: 15 ug via INTRATHECAL

## 2016-04-26 MED ORDER — BUTORPHANOL TARTRATE 1 MG/ML IJ SOLN: 1.0000 mg | INTRAMUSCULAR | Status: DC | PRN

## 2016-04-26 MED ORDER — ACETAMINOPHEN 325 MG PO TABS
650.0000 mg | ORAL_TABLET | Freq: Four times a day (QID) | ORAL | Status: AC
  Administered 2016-04-26 – 2016-04-27 (×4): 650 mg via ORAL
  Filled 2016-04-26 (×3): qty 2

## 2016-04-26 MED ORDER — OXYTOCIN 40 UNITS IN LACTATED RINGERS INFUSION - SIMPLE MED
INTRAVENOUS | Status: DC | PRN
  Administered 2016-04-26: 1000 mL via INTRAVENOUS

## 2016-04-26 MED ORDER — LACTATED RINGERS IV SOLN: 500.0000 mL | INTRAVENOUS | Status: DC | PRN

## 2016-04-26 MED ORDER — DIPHENHYDRAMINE HCL 25 MG PO CAPS: 25.0000 mg | ORAL_CAPSULE | ORAL | Status: DC | PRN

## 2016-04-26 MED ORDER — PENICILLIN G POTASSIUM 5000000 UNITS IJ SOLR
2.5000 10*6.[IU] | INTRAVENOUS | Status: DC
  Filled 2016-04-26 (×5): qty 2.5

## 2016-04-26 MED ORDER — BUPIVACAINE HCL (PF) 0.5 % IJ SOLN
INTRAMUSCULAR | Status: AC
  Filled 2016-04-26: qty 30

## 2016-04-26 MED ORDER — OXYTOCIN 40 UNITS IN LACTATED RINGERS INFUSION - SIMPLE MED
2.5000 [IU]/h | INTRAVENOUS | Status: DC
  Filled 2016-04-26: qty 1000

## 2016-04-26 MED ORDER — DEXTROSE IN LACTATED RINGERS 5 % IV SOLN: INTRAVENOUS | Status: DC

## 2016-04-26 SURGICAL SUPPLY — 23 items
BARRIER ADHS 3X4 INTERCEED (GAUZE/BANDAGES/DRESSINGS) ×2 IMPLANT
CANISTER SUCT 3000ML (MISCELLANEOUS) ×2 IMPLANT
CATH KIT ON-Q SILVERSOAK 5IN (CATHETERS) ×4 IMPLANT
CHLORAPREP W/TINT 26ML (MISCELLANEOUS) ×2 IMPLANT
DRSG TELFA 3X8 NADH (GAUZE/BANDAGES/DRESSINGS) ×2 IMPLANT
ELECT CAUTERY BLADE 6.4 (BLADE) ×2 IMPLANT
ELECT REM PT RETURN 9FT ADLT (ELECTROSURGICAL) ×2
ELECTRODE REM PT RTRN 9FT ADLT (ELECTROSURGICAL) ×1 IMPLANT
GAUZE SPONGE 4X4 12PLY STRL (GAUZE/BANDAGES/DRESSINGS) ×2 IMPLANT
GLOVE BIO SURGEON STRL SZ8 (GLOVE) ×2 IMPLANT
GOWN STRL REUS W/ TWL LRG LVL3 (GOWN DISPOSABLE) ×2 IMPLANT
GOWN STRL REUS W/ TWL XL LVL3 (GOWN DISPOSABLE) ×1 IMPLANT
GOWN STRL REUS W/TWL LRG LVL3 (GOWN DISPOSABLE) ×2
GOWN STRL REUS W/TWL XL LVL3 (GOWN DISPOSABLE) ×1
NS IRRIG 1000ML POUR BTL (IV SOLUTION) ×2 IMPLANT
PACK C SECTION AR (MISCELLANEOUS) ×2 IMPLANT
PAD OB MATERNITY 4.3X12.25 (PERSONAL CARE ITEMS) ×2 IMPLANT
PAD PREP 24X41 OB/GYN DISP (PERSONAL CARE ITEMS) ×2 IMPLANT
STAPLER INSORB 30 2030 C-SECTI (MISCELLANEOUS) ×2 IMPLANT
STRAP SAFETY BODY (MISCELLANEOUS) ×2 IMPLANT
SUT CHROMIC 1 CTX 36 (SUTURE) ×6 IMPLANT
SUT PLAIN GUT 0 (SUTURE) ×4 IMPLANT
SUT VIC AB 0 CT1 36 (SUTURE) ×4 IMPLANT

## 2016-04-26 NOTE — OR Nursing (Signed)
Placenta stored in refrigerator 

## 2016-04-26 NOTE — Anesthesia Procedure Notes (Signed)
Spinal  Patient location during procedure: OB Start time: 04/26/2016 7:15 PM End time: 04/26/2016 7:20 PM Staffing Anesthesiologist: Emmie Niemann Performed: anesthesiologist  Preanesthetic Checklist Completed: patient identified, site marked, surgical consent, pre-op evaluation, timeout performed, IV checked, risks and benefits discussed and monitors and equipment checked Spinal Block Patient position: sitting Prep: ChloraPrep Patient monitoring: heart rate, continuous pulse ox, blood pressure and cardiac monitor Approach: midline Location: L4-5 Injection technique: single-shot Needle Needle type: Whitacre and Introducer  Needle gauge: 24 G Needle length: 9 cm Additional Notes Negative paresthesia. Negative blood return. Positive free-flowing CSF. Expiration date of kit checked and confirmed. Patient tolerated procedure well, without complications.

## 2016-04-26 NOTE — Anesthesia Preprocedure Evaluation (Signed)
Anesthesia Evaluation  Patient identified by MRN, date of birth, ID band Patient awake    Reviewed: Allergy & Precautions, NPO status , Patient's Chart, lab work & pertinent test results  History of Anesthesia Complications Negative for: history of anesthetic complications  Airway Mallampati: II  TM Distance: >3 FB Neck ROM: Full    Dental no notable dental hx.    Pulmonary sleep apnea , neg COPD, former smoker,    breath sounds clear to auscultation- rhonchi       Cardiovascular Exercise Tolerance: Good (-) hypertension(-) CAD and (-) Past MI  Rhythm:Regular Rate:Normal - Systolic murmurs and - Diastolic murmurs    Neuro/Psych Anxiety Depression negative neurological ROS     GI/Hepatic negative GI ROS, Neg liver ROS,   Endo/Other  diabetes (gDM, diet controlled)  Renal/GU negative Renal ROS     Musculoskeletal negative musculoskeletal ROS (+)   Abdominal (+) + obese, Gravid abdomen   Peds  Hematology negative hematology ROS (+)   Anesthesia Other Findings   Reproductive/Obstetrics (+) Pregnancy                             Anesthesia Physical Anesthesia Plan  ASA: II  Anesthesia Plan: Spinal   Post-op Pain Management:    Induction:   Airway Management Planned:   Additional Equipment:   Intra-op Plan:   Post-operative Plan:   Informed Consent: I have reviewed the patients History and Physical, chart, labs and discussed the procedure including the risks, benefits and alternatives for the proposed anesthesia with the patient or authorized representative who has indicated his/her understanding and acceptance.     Plan Discussed with: CRNA and Anesthesiologist  Anesthesia Plan Comments:         Lab Results  Component Value Date   WBC 14.0 (H) 04/26/2016   HGB 12.6 04/26/2016   HCT 36.4 04/26/2016   MCV 83.0 04/26/2016   PLT 291 04/26/2016    Anesthesia Quick  Evaluation

## 2016-04-26 NOTE — Anesthesia Postprocedure Evaluation (Signed)
Anesthesia Post Note  Patient: Dierdre ForthMichalin M Fels  Procedure(s) Performed: Procedure(s) (LRB): CESAREAN SECTION (N/A)  Patient location during evaluation: L&D Anesthesia Type: Spinal Level of consciousness: oriented and awake and alert Pain management: pain level controlled Vital Signs Assessment: post-procedure vital signs reviewed and stable Respiratory status: spontaneous breathing, respiratory function stable and nonlabored ventilation Cardiovascular status: blood pressure returned to baseline and stable Postop Assessment: no headache, no backache and spinal receding Anesthetic complications: no    Last Vitals:  Vitals:   04/26/16 1935 04/26/16 1936  BP:    Pulse: 78 93  Resp: 16 20  Temp:      Last Pain:  Vitals:   04/26/16 1344  TempSrc: Oral                 Mariah Gerstenberger

## 2016-04-26 NOTE — Progress Notes (Signed)
[facility] called at 1650pm Dr Feliberto GottronSchermerhorn enroute.

## 2016-04-26 NOTE — Brief Op Note (Signed)
04/26/2016  6:29 PM  dleivery date : 04/26/16 @1739   PATIENT:  Dierdre ForthMichalin M Leamy  20 y.o. female  PRE-OPERATIVE DIAGNOSIS:  Non reassuring fetal monitoring on biophysical profile  Breech presentation  36+[redacted] weeks EGA Gestational diabetic  POST-OPERATIVE DIAGNOSIS:  Same as above  PROCEDURE:  Procedure(s): CESAREAN SECTION (N/A) LTCS , primary . Breech extraction  SURGEON:  Surgeon(s) and Role:    Suzy Bouchard* Payne Garske J Kharter Sestak, MD - Primary  PHYSICIAN ASSISTANT: Milon Scorearon Jones , CNM  ASSISTANTS: none   ANESTHESIA:   spinal  EBL:  Total I/O In: 1500 [I.V.:1500] Out: 700 [Urine:100; Blood:600]  BLOOD ADMINISTERED:none  DRAINS: Urinary Catheter (Foley)   LOCAL MEDICATIONS USED:  MARCAINE     SPECIMEN:  No Specimen  DISPOSITION OF SPECIMEN:  N/A  COUNTS:  YES  TOURNIQUET:  * No tourniquets in log *  DICTATION: .Other Dictation: Dictation Number verbal  PLAN OF CARE: Admit to inpatient   PATIENT DISPOSITION:  PACU - hemodynamically stable.   Delay start of Pharmacological VTE agent (>24hrs) due to surgical blood loss or risk of bleeding: not applicable

## 2016-04-26 NOTE — Progress Notes (Signed)
36+4 week GDM pt with a BPP 4/10 in MFM today . +Oligo . Dr Fayrene FearingJames concerned about fetal wellbeing and recommended delivery today . Breech , therefore requiring LTCS. I have counseled the pt for the risks of the procedure. All questions answered . Proced with LTCS and on q pump placement.

## 2016-04-26 NOTE — Transfer of Care (Signed)
Immediate Anesthesia Transfer of Care Note  Patient: [name]  Procedure(s) Performed: Procedure(s): CESAREAN SECTION (N/A)  Patient Location: [facility]  Anesthesia Type:Spinal  Level of Consciousness: awake and alert   Airway & Oxygen Therapy: Patient Spontanous Breathing  Post-op Assessment: Report given to RN and Post -op Vital signs reviewed and stable  Post vital signs: Reviewed  Last Vitals:  Vitals:   04/26/16 1344 04/26/16 1823  BP: 138/80 115/79  Pulse: (!) 112 90  Resp: 20 14  Temp:  36.6 C    Last Pain:  Vitals:   04/26/16 1344  TempSrc: Oral         Complications: No apparent anesthesia complications

## 2016-04-26 NOTE — H&P (Signed)
20 [facility]op [facility] of 05/21/16 dated by LMP of 07/21/15 significant for A1GDM controlled by diet but, sugars not well recorded and Fasting normal <50% of the time  And < 50% Over 140, Pt sent by [facility] Dr Fayrene FearingJames for BPP 4/10 and non-reactive NST and baby is also Breech on US. Disc care of pt with Dr Felicita GageJS and plan to do a lTCS at 1700 once pt is situated and labs are back. Pt is on fetal monitor at this time. Past Medical History:  Diagnosis Date  . Anxiety   . Depression   . Gestational diabetes   . Gestational diabetes mellitus (GDM) affecting pregnancy, antepartum (HCC) 2017  . Sleep apnea    Past Surgical History:  Procedure Laterality Date  . TONSILLECTOMY    History reviewed. No pertinent family history.  Social History   Social History  . Marital status: Single    Spouse name: N/A  . Number of children: N/A  . Years of education: N/A   Occupational History  . Not on file.   Social History Main Topics  . Smoking status: Former Smoker    Types: Cigarettes  . Smokeless tobacco: Former NeurosurgeonUser  . Alcohol use No  . Drug use: No  . Sexual activity: No   Other Topics Concern  . Not on file   Social History Narrative  . No narrative on file  Gen: 1` yo HIspanic female in NAD.  Heart: S1S2, Grade 2/6 sytolic murmur heard. Lungs: CTA bilat, no W/R/R. Abd: Gravid, Breech position. Extrems: DTR's 1+/0, 1+ edema A: IUP at 36 4/7 weeks 2. BPP 4/8 today, NR NST 3. Breech P: Disc with Dr Allena KatzPatel, Dr Fayrene FearingJames and Dr Feliberto GottronSchermerhorn and we are planning a LTCS at 1700. We are pending orders and labs. 2. Pt is informed of the risks, benefits and alternatvies and agrees to the LTCS. She is aware of the risks for bleeding, infection, damage to internal organs and anesthesia risks and the poss of death. Pt agrees and wants to proceed with a LTCS.

## 2016-04-27 ENCOUNTER — Encounter: Payer: Self-pay | Admitting: Obstetrics and Gynecology

## 2016-04-27 LAB — CBC
HCT: 30.7 % — ABNORMAL LOW (ref 35.0–47.0)
HEMOGLOBIN: 10.7 g/dL — AB (ref 12.0–16.0)
MCH: 29.2 pg (ref 26.0–34.0)
MCHC: 34.8 g/dL (ref 32.0–36.0)
MCV: 84.1 fL (ref 80.0–100.0)
PLATELETS: 256 10*3/uL (ref 150–440)
RBC: 3.65 MIL/uL — AB (ref 3.80–5.20)
RDW: 14 % (ref 11.5–14.5)
WBC: 14.7 10*3/uL — AB (ref 3.6–11.0)

## 2016-04-27 LAB — GLUCOSE, CAPILLARY: GLUCOSE-CAPILLARY: 48 mg/dL — AB (ref 65–99)

## 2016-04-27 LAB — RPR: RPR: NONREACTIVE

## 2016-04-27 NOTE — Op Note (Signed)
[name], [name]              ACCOUNT NO.:  [id number]  MEDICAL RECORD NO.:  [id number]  LOCATION:                                 FACILITY:  PHYSICIAN:  Shirley Corner, MDDATE OF BIRTH:  08/25/1995  DATE OF PROCEDURE: DATE OF DISCHARGE:                              OPERATIVE REPORT   PREOPERATIVE DIAGNOSIS: 1. 21+ 4 weeks estimated gestational age. 2. Non-reassuring biophysical profile. 3. Breech presentation.  POSTOPERATIVE DIAGNOSIS: 1. 21+ 4 weeks estimated gestational age. 2. Non-reassuring biophysical profile. 3. Breech presentation.  PROCEDURE PERFORMED: 1. Primary low-transverse cesarean section. 2. On-Q pump placement.  SURGEON:  Shirley Corner, MD  ANESTHESIA:  Spinal.  SURGEON:  Shirley Corner, MD  FIRST ASSISTANT:  Deatra Robinson, certified nurse-midwife.  INDICATION:  A 20 year old gravida 1, para 0 patient at 27 +4 weeks estimated gestational age.  The patient was at [facility] for an evaluation today.  Dr. Adline Potter evaluated the patient and performed a biophysical profile, which was deemed 4/8.  Dr. Fayrene Fearing felt that the status of the fetus was tenuous and therefore recommended delivery today even though she is a gestational diabetic.  The patient states that her fasting sugars were normally below 90 and postprandial levels normally below 140.  Ultrasound documented the baby was in the breech presentation today.  DESCRIPTION OF PROCEDURE:  After adequate spinal anesthesia, patient was placed in dorsal supine position, hip rolled on the right side.  The patient was prepped and draped in normal sterile fashion.  She did receive 2 g IV Ancef prior to commencement of the case.  Time-out was performed.  A Pfannenstiel incision was made 2 fingerbreadths above the symphysis pubis.  Sharp dissection was used to identify the fascia. Fascia was opened in the midline and opened in a transverse fashion. The superior aspect of the  fascia was grasped with Kocher clamps and the recti muscles were dissected free.  Inferior aspect of the fascia was grasped with Kocher clamps and pyramidalis muscles dissected free. Entry into the peritoneal cavity was accomplished sharply.  The vesicouterine peritoneal fold was identified and opened and a bladder flap was created and the bladder was reflected inferiorly.  A low transverse uterine incision was made upon entry into the endometrial cavity.  Clear fluid resulted.  Fetal legs were identified and brought to the incision.  The legs were draped with a wound towel and the arms were delivered without difficulty followed by delivery of the head without difficulty.  Vigorous female was dried on the patient's abdomen for 60 seconds with delayed cord clamping and then was passed off to pediatrician staff.  The placenta was manually delivered followed by delivery of the uterus externally.  The endometrial cavity was wiped clean with laparotomy tape and the cervix was opened with a ring forceps and this was passed off the table.  Uterine incision was closed with 1 chromic suture in a running locking fashion.  Good approximation of edges.  Good hemostasis was noted.  The uterus was slightly boggy after IV Pitocin was administered, therefore 0.2 mg intramuscular Methergine was administered.  Fallopian tubes and ovaries appeared normal. Posterior cul-de-sac was irrigated and suctioned and uterus was  placed back into the abdominal cavity followed by paracolic gutters being wiped clean with laparotomy tape.  Uterine incision again appeared hemostatic and Interceed was placed over the uterine incision in a T-shaped fashion.  The superior aspect of the fascia was regrasped with Kocher clamps, and the On-Q pump catheters were advanced from infraumbilical position to a subfascial position.  Fascia was closed over top these catheters with 0 Vicryl suture in a running nonlocking fashion with  good approximation of edges.  Given the depth of the subcutaneous tissue approximately 5 cm, the subcutaneous tissue was closed with 2-0 chromic suture.  This was after the incision was irrigated and bovied for hemostasis.  The skin was reapproximated with Insorb absorbable staples. Good cosmetic effect noted.  The On-Q pump catheters were secured at the skin level with LiquiBand and Steri-Strips to the skin and a Tegaderm was placed over this.  Each catheter was then loaded with 5 mL of 0.5% Marcaine.  COMPLICATIONS:  There were no complications.  ESTIMATED BLOOD LOSS:  600 mL.  INTRAOPERATIVE FLUIDS:  1500 mL.  URINE OUTPUT:  100 mL.  The patient tolerated procedure well, was taken to recovery room in good condition.    ______________________________ Shirley Cornerhomas Kelin Borum, MD   ______________________________ Shirley Cornerhomas Gurfateh Mcclain, MD    TS/MEDQ  D:  04/26/2016  T:  04/27/2016  Job:  [id number]

## 2016-04-27 NOTE — Progress Notes (Signed)
POSTOPERATIVE DAY # 1 S/P Primary LTCS for non-reassuring BPP and Breech presentation    S:         Reports feeling good             Tolerating po intake / no nausea / no vomiting  + flatus / no BM             Bleeding is light             Pain controlled withOn-Q pump, Toradol, Percocet             Up ad lib / ambulatory/ voiding QS  Newborn breast feeding - latching well    O:  VS: BP 110/60 (BP Location: Right Arm)   Pulse 78   Temp 98.4 F (36.9 C) (Oral)   Resp 18   Ht 5\' 4"  (1.626 m)   Wt 114.8 kg (253 lb)   LMP 07/21/2015   SpO2 98%   Breastfeeding? Unknown   BMI 43.43 kg/m    LABS:               Recent Labs  04/26/16 1426 04/27/16 0546  WBC 14.0* 14.7*  HGB 12.6 10.7*  PLT 291 256               Bloodtype: --/--/O POS (09/21 1426)  Rubella: Immune (03/01 0000)                                             I&O: Intake/Output      09/21 0701 - 09/22 0700 09/22 0701 - 09/23 0700   I.V. (mL/kg) 1500 (13.1)    Total Intake(mL/kg) 1500 (13.1)    Urine (mL/kg/hr) 525    Blood 600    Total Output 1125     Net +375                       Physical Exam:             Alert and Oriented X3  Lungs: Clear and unlabored  Heart: regular rate and rhythm / no mumurs  Abdomen: soft, non-tender, non-distended, active bowel sounds in all 4 quadrants, On-Q pump c/d/i, no erythema             Fundus: firm, non-tender, U-E             Dressing: pressure dressing intact               Incision:  approximated with Insorb stables / no erythema / no ecchymosis / no drainage  Perineum: intact  Lochia: scant  Extremities: no edema, no calf pain or tenderness  A:        POD # 1 S/P Primary LTCS for non-reassuring BPP and Breech presentation            ABL Anemia   Class A1GDM - stable, delivered   Obesity  P:        Routine postoperative care              Remove foley this afternoon  Continue current care  See lactation today  Carlean JewsMeredith Hiren Peplinski, CNM

## 2016-04-28 NOTE — Progress Notes (Signed)
POSTOPERATIVE DAY # 2 S/P S/P Primary LTCS for non-reassuring BPP and Breech presentation    S:         Reports feeling better today, was able to sleep well overnight             Tolerating po intake / no nausea / no vomiting / + flatus / no BM             Bleeding is light             Pain controlled withOn-Q pump, Motrin, Percocet             Up ad lib / ambulatory/ voiding QS  Newborn breast feeding with SNS supplementation    O:  VS: BP 115/69 (BP Location: Left Arm)   Pulse 100   Temp 98.8 F (37.1 C) (Oral)   Resp 18   Ht 5\' 4"  (1.626 m)   Wt 114.8 kg (253 lb)   LMP 07/21/2015   SpO2 99%   Breastfeeding? Unknown   BMI 43.43 kg/m    LABS:               Recent Labs  04/26/16 1426 04/27/16 0546  WBC 14.0* 14.7*  HGB 12.6 10.7*  PLT 291 256               Bloodtype: --/--/O POS (09/21 1426)  Rubella: Immune (03/01 0000)                                             I&O: Intake/Output      09/22 0701 - 09/23 0700 09/23 0701 - 09/24 0700   P.O. 240    I.V. (mL/kg)     Total Intake(mL/kg) 240 (2.1)    Urine (mL/kg/hr) 2800 (1)    Blood     Total Output 2800     Net -2560                       Physical Exam:             Alert and Oriented X3  Lungs: Clear and unlabored  Heart: regular rate and rhythm / no mumurs  Abdomen: soft, non-tender, non-distended, active bowel sounds in all 4 quadrants, ON-Q pump intact, no significant erythema or ecchymosis              Fundus: firm, non-tender, U-2             Dressing: C/D/I - removed by me today               Incision:  approximated with Insorb sutures / no erythema / no ecchymosis / no drainage  Perineum: intact  Lochia: scant  Extremities: mild non-pitting edema, no calf pain or tenderness A:        POD # 2 S/P S/P Primary LTCS for non-reassuring BPP and Breech presentation             ABL Anemia   Class A1GDM - stable, delivered   Obesity  P:        Routine postoperative care              May shower today    May ambulate in halls   Needs 2 hr GTT at 6 week PP visit  Will plan for dc in AM  Delta Air LinesMeredith Charell Faulk,  CNM

## 2016-04-29 MED ORDER — VARICELLA VIRUS VACCINE LIVE 1350 PFU/0.5ML IJ SUSR
0.5000 mL | Freq: Once | INTRAMUSCULAR | Status: AC
  Administered 2016-04-29: 0.5 mL via SUBCUTANEOUS
  Filled 2016-04-29 (×2): qty 0.5

## 2016-04-29 MED ORDER — OXYCODONE-ACETAMINOPHEN 5-325 MG PO TABS: 2.0000 | ORAL_TABLET | ORAL | 0 refills | Status: DC | PRN

## 2016-04-29 MED ORDER — IBUPROFEN 600 MG PO TABS: 600.0000 mg | ORAL_TABLET | Freq: Four times a day (QID) | ORAL | 0 refills | Status: DC

## 2016-04-29 NOTE — Progress Notes (Signed)
CPR video viewed  and teaching done with infants mother. Return demonstration performed.  All questions answered. 

## 2016-04-29 NOTE — Discharge Summary (Signed)
  Obstetric Discharge Summary   Patient ID: [name] MRN: [id number] DOB/AGE: 01-03-1996 20 y.o.   Date of Admission: 04/26/2016  Date of Discharge: 04/29/16  Admitting Diagnosis: Scheduled cesarean section at 1385w3d for non-reassuring fetal monitoring, BPP of 4/10, Breech  Secondary Diagnosis: Poorly controlled Gestational diabetes diet controlled (A1), oligohydramnios, and Obesity, Varicella Non-Immune  Mode of Delivery: Primary LTCS on 04/26/16     Discharge Diagnosis: Primary LTCS for non-reassuring fetal monitoring, oligohydramnios, and obesity.  Preterm scheduled LTCS at 36+4 weeks. ABL Anemia, Varicella Non-Immune   Intrapartum Procedures: Atificial rupture of membranes   Post partum procedures: Ferrous sulfate, Varivax given prior to discharge.  On-Q pump removed per patient request.   Complications: none   Brief Hospital Course  (Cesarean Section): [name] is a G1P0101 who underwent preterm cesarean section on 04/26/2016 for non-reassuring BPP and breech presentation.  Patient had an uncomplicated surgery; for further details of this surgery, please refer to the operative note.  Patient had an uncomplicated postpartum course.  By time of discharge on POD#3, her pain was controlled on oral pain medications; she had appropriate lochia and was ambulating, voiding without difficulty, tolerating regular diet and passing flatus.   She was deemed stable for discharge to home.    Labs: CBC Latest Ref Rng & Units 04/27/2016 04/26/2016  WBC 3.6 - 11.0 K/uL 14.7(H) 14.0(H)  Hemoglobin 12.0 - 16.0 g/dL 10.7(L) 12.6  Hematocrit 35.0 - 47.0 % 30.7(L) 36.4  Platelets 150 - 440 K/uL 256 291   O POS  Physical exam:  Blood pressure 124/65, pulse 97, temperature 97.5 F (36.4 C), temperature source Oral, resp. rate 17, height 5\' 4"  (1.626 m), weight 114.8 kg (253 lb), last menstrual period 07/21/2015, SpO2 100 %, unknown if currently breastfeeding. General: alert and no  distress Lochia: appropriate Abdomen: soft, NT Uterine Fundus: firm Incision: healing well, no significant drainage, no dehiscence, no significant erythema, On-Q infusing.   Extremities: No evidence of DVT seen on physical exam. No lower extremity edema.  Discharge Instructions: Per After Visit Summary. Activity: Advance as tolerated. Pelvic rest for 6 weeks.  Also refer to After Visit Summary Diet: Regular Medications:   Medication List    TAKE these medications   ibuprofen 600 MG tablet Commonly known as:  ADVIL,MOTRIN Take 1 tablet (600 mg total) by mouth every 6 (six) hours.   oxyCODONE-acetaminophen 5-325 MG tablet Commonly known as:  PERCOCET/ROXICET Take 2 tablets by mouth every 4 (four) hours as needed (pain scale > 7).   prenatal multivitamin Tabs tablet Take 1 tablet by mouth daily at 12 noon.      Outpatient follow up:  Follow-up Information    SCHERMERHORN,THOMAS, MD. Schedule an appointment as soon as possible for a visit in 2 week(s).   Specialty:  Obstetrics and Gynecology Why:  Post-op appointment, then in 4 weeks with [facility] Real[facility]les [facility] or [facility] Needs a 2 hour GTT at 6 week visit Contact information: [address] [facility] [place] [id number] [phone]          Postpartum contraception: unsure. Discussed several options and pt. wants to think about it.  Offered Depo prior to discharge and she declined.   Discharged Condition: good  Discharged to: home   Newborn Data:  Baby Boy   Disposition:home with mother  Apgars: APGAR (1 MIN): 9   APGAR (5 MINS): 9    Baby Feeding: Bottle and Breast  Karena AddisonSigmon, Jalan Bodi C, CNM 04/29/2016

## 2016-04-29 NOTE — Progress Notes (Signed)
Patient discharge to home via wheelchair and baby in car seat.  On Q pump removed.

## 2017-04-10 ENCOUNTER — Emergency Department
Admission: EM | Admit: 2017-04-10 | Discharge: 2017-04-10 | Disposition: A | Discharge status: Home / Self Care | Payer: Medicaid Other | Attending: Emergency Medicine | Admitting: Emergency Medicine

## 2017-04-10 ENCOUNTER — Encounter: Payer: Self-pay | Admitting: Medical Oncology

## 2017-04-10 DIAGNOSIS — Z87891 Personal history of nicotine dependence: Secondary | ICD-10-CM | POA: Insufficient documentation

## 2017-04-10 DIAGNOSIS — Y939 Activity, unspecified: Secondary | ICD-10-CM | POA: Insufficient documentation

## 2017-04-10 DIAGNOSIS — M5430 Sciatica, unspecified side: Secondary | ICD-10-CM | POA: Diagnosis not present

## 2017-04-10 DIAGNOSIS — S39012A Strain of muscle, fascia and tendon of lower back, initial encounter: Secondary | ICD-10-CM | POA: Diagnosis not present

## 2017-04-10 DIAGNOSIS — Z79899 Other long term (current) drug therapy: Secondary | ICD-10-CM | POA: Insufficient documentation

## 2017-04-10 DIAGNOSIS — X58XXXA Exposure to other specified factors, initial encounter: Secondary | ICD-10-CM | POA: Diagnosis not present

## 2017-04-10 DIAGNOSIS — Y999 Unspecified external cause status: Secondary | ICD-10-CM | POA: Insufficient documentation

## 2017-04-10 DIAGNOSIS — S3992XA Unspecified injury of lower back, initial encounter: Secondary | ICD-10-CM | POA: Diagnosis present

## 2017-04-10 DIAGNOSIS — Y929 Unspecified place or not applicable: Secondary | ICD-10-CM | POA: Insufficient documentation

## 2017-04-10 MED ORDER — DEXAMETHASONE SODIUM PHOSPHATE 10 MG/ML IJ SOLN
10.0000 mg | Freq: Once | INTRAMUSCULAR | Status: AC
  Administered 2017-04-10: 10 mg via INTRAMUSCULAR
  Filled 2017-04-10: qty 1

## 2017-04-10 MED ORDER — CYCLOBENZAPRINE HCL 5 MG PO TABS: 5.0000 mg | ORAL_TABLET | Freq: Three times a day (TID) | ORAL | 0 refills | Status: DC | PRN

## 2017-04-10 MED ORDER — PREDNISONE 10 MG (21) PO TBPK: ORAL_TABLET | ORAL | 0 refills | Status: DC

## 2017-04-10 MED ORDER — CYCLOBENZAPRINE HCL 10 MG PO TABS
5.0000 mg | ORAL_TABLET | Freq: Once | ORAL | Status: AC
  Administered 2017-04-10: 5 mg via ORAL
  Filled 2017-04-10: qty 1

## 2017-04-10 NOTE — Discharge Instructions (Signed)
Take medication as prescribed. Return to emergency department if symptoms worsen and follow-up with PCP as needed.    Do not breast feed over the next 7 days while taking these medications.You may resume proceeding after you completed a course of medications

## 2017-04-10 NOTE — ED Provider Notes (Signed)
[facility] Emergency Department Provider Note   ____________________________________________   I have reviewed the triage vital signs and the nursing notes.   HISTORY  Chief Complaint Back Pain    HPI [name] is a 21 y.o. female presents to emergency department with lumbar back pain without traumatic injury. Patient reports onset of pain yesterday while at work. Patient works 9 hour shifts, sitting mostly. Patient localizes her pain to the central lumbar region without radiating pain. In the past, patient has managed symptoms. Today she notes the symptoms have not improved with rest and activity modification. Patient describes pain as aching and muscle tightness. Patient denies bowel/bladder dysfunction, saddle anesthesia or radiculopathy. Patient denies fever, chills, headache, vision changes, chest pain, chest tightness, shortness of breath, abdominal pain, nausea and vomiting.  Past Medical History:  Diagnosis Date  . Anxiety   . Depression   . Gestational diabetes   . Gestational diabetes mellitus (GDM) affecting pregnancy, antepartum 2017  . Sleep apnea     Patient Active Problem List   Diagnosis Date Noted  . Indication for care in labor or delivery 04/26/2016  . Post-operative state 04/26/2016    Past Surgical History:  Procedure Laterality Date  . CESAREAN SECTION N/A 04/26/2016   Procedure: CESAREAN SECTION;  Surgeon: Suzy Bouchardhomas J Schermerhorn, MD;  Location: [facility] ORS;  Service: Obstetrics;  Laterality: N/A;  . TONSILLECTOMY      Prior to Admission medications   Medication Sig Start Date End Date Taking? Authorizing Provider  cyclobenzaprine (FLEXERIL) 5 MG tablet Take 1 tablet (5 mg total) by mouth 3 (three) times daily as needed. 04/10/17   Jerauld Bostwick M, PA-C  ibuprofen (ADVIL,MOTRIN) 600 MG tablet Take 1 tablet (600 mg total) by mouth every 6 (six) hours. 04/29/16   Karena AddisonSigmon, Meredith C, CNM  oxyCODONE-acetaminophen  (PERCOCET/ROXICET) 5-325 MG tablet Take 2 tablets by mouth every 4 (four) hours as needed (pain scale > 7). 04/29/16   Sigmon, Scarlette SliceMeredith C, CNM  predniSONE (STERAPRED UNI-PAK 21 TAB) 10 MG (21) TBPK tablet Take 6 tablets on day 1. Take 5 tablets on day 2. Take 4 tablets on day 3. Take 3 tablets on day 4. Take 2 tablets on day 5. Take 1 tablets on day 6. 04/10/17   Dasean Brow M, PA-C  Prenatal Vit-Fe Fumarate-FA (PRENATAL MULTIVITAMIN) TABS tablet Take 1 tablet by mouth daily at 12 noon.    [provider]    Allergies Patient has no known allergies.  No family history on file.  Social History Social History  Substance Use Topics  . Smoking status: Former Smoker    Types: Cigarettes  . Smokeless tobacco: Former NeurosurgeonUser  . Alcohol use No    Review of Systems Constitutional: Negative for fever/chills Eyes: No visual changes. ENT:  Negative for sore throat and for difficulty swallowing Cardiovascular: Denies chest pain. Respiratory: Denies cough. Denies shortness of breath. Gastrointestinal: No abdominal pain.  No nausea, vomiting, diarrhea. Genitourinary: Negative for dysuria. Musculoskeletal: Positive for lumbar back pain Skin: Negative for rash. Neurological: Negative for headaches. Able to ambulate. ____________________________________________   PHYSICAL EXAM:  VITAL SIGNS: ED Triage Vitals  Enc Vitals Group     BP 04/10/17 1223 137/86     Pulse Rate 04/10/17 1223 99     Resp 04/10/17 1223 19     Temp 04/10/17 1223 98.4 F (36.9 C)     Temp Source 04/10/17 1223 Oral     SpO2 04/10/17 1223 97 %  Weight 04/10/17 1223 280 lb (127 kg)     Height 04/10/17 1223 5\' 4"  (1.626 m)     Head Circumference --      Peak Flow --      Pain Score 04/10/17 1222 6     Pain Loc --      Pain Edu? --      Excl. in GC? --     Constitutional: Alert and oriented. Well appearing and in no acute distress.  Eyes: Conjunctivae are normal. PERRL. EOMI  Head: Normocephalic and  atraumatic. ENT:      Ears: Canals clear. TMs intact bilaterally.      Nose: No congestion/rhinnorhea.      Mouth/Throat: Mucous membranes are moist.  Neck:Supple. No thyromegaly. No stridor.  Cardiovascular: Normal rate, regular rhythm. Normal S1 and S2.  Good peripheral circulation. Respiratory: Normal respiratory effort without tachypnea or retractions. Lungs CTAB. No wheezes/rales/rhonchi.  Hematological/Lymphatic/Immunological: No cervical lymphadenopathy. Cardiovascular: Normal rate, regular rhythm. Normal distal pulses. Gastrointestinal: Bowel sounds 4 quadrants. Soft and nontender to palpation.  No CVA tenderness. Musculoskeletal: Bilateral lumbar back pain with palpable tenderness along lumbar paraspinals. Intact lumbar range of motion without deformities noted. No spinous process tenderness. Negative radiculopathy. Intact lower extremity strength, sensation and range of motion.Negative bowel/bladder dysfunction or saddle anesthesia Neurologic: Normal speech and language. No gross focal neurologic deficits are appreciated. No gait instability.  Skin:  Skin is warm, dry and intact. No rash noted. Psychiatric: Mood and affect are normal. Speech and behavior are normal. Patient exhibits appropriate insight and judgement.  ____________________________________________   LABS (all labs ordered are listed, but only abnormal results are displayed)  Labs Reviewed - No data to display ____________________________________________  EKG None ____________________________________________  RADIOLOGY None ____________________________________________   PROCEDURES  Procedure(s) performed: No    Critical Care performed: no ____________________________________________   INITIAL IMPRESSION / ASSESSMENT AND PLAN / ED COURSE  Pertinent labs & imaging results that were available during my care of the patient were reviewed by me and considered in my medical decision making (see chart  for details).  Patient presents to emergency department with bilateral lumbar back pain without radiculopathy. History and physical exam findingsare reassuring symptoms are consistent with mild lumbar strain associated muscle spasms. Patient noted improvement of symptoms following Decadronand Flexeril given during the course of care in the emergency department. Patient will be prescribed prednisone taper and Flexeril  as needed for muscle spasms. Patient advised to follow up with PCP as needed or return to the emergency department if symptoms return or worsen. Patientinformed of clinical course, understand medical decision-making process, and agree with plan.   ____________________________________________   FINAL CLINICAL IMPRESSION(S) / ED DIAGNOSES  Final diagnoses:  Strain of lumbar region, initial encounter  Sciatica, unspecified laterality       NEW MEDICATIONS STARTED DURING THIS VISIT:  Discharge Medication List as of 04/10/2017  1:19 PM    START taking these medications   Details  cyclobenzaprine (FLEXERIL) 5 MG tablet Take 1 tablet (5 mg total) by mouth 3 (three) times daily as needed., Starting Wed 04/10/2017, Print    predniSONE (STERAPRED UNI-PAK 21 TAB) 10 MG (21) TBPK tablet Take 6 tablets on day 1. Take 5 tablets on day 2. Take 4 tablets on day 3. Take 3 tablets on day 4. Take 2 tablets on day 5. Take 1 tablets on day 6., Print         Note:  This document was prepared using Conservation officer, historic buildings  and may include unintentional dictation errors.    Clois Comber, PA-C 04/10/17 1624    Emily Filbert, MD 04/11/17 [phone]

## 2017-04-10 NOTE — ED Triage Notes (Signed)
Pt reports lower back pain without injury. Pt is ambulatory.

## 2017-04-10 NOTE — ED Notes (Signed)
Pt ambulatory at time of discharge and reports relief after medication administration. Pt denies having questions at this time

## 2017-05-03 ENCOUNTER — Encounter: Payer: Self-pay | Admitting: Emergency Medicine

## 2017-05-03 ENCOUNTER — Emergency Department
Admission: EM | Admit: 2017-05-03 | Discharge: 2017-05-03 | Disposition: A | Discharge status: Home / Self Care | Payer: Medicaid Other | Attending: Emergency Medicine | Admitting: Emergency Medicine

## 2017-05-03 DIAGNOSIS — M545 Low back pain, unspecified: Secondary | ICD-10-CM

## 2017-05-03 DIAGNOSIS — Z87891 Personal history of nicotine dependence: Secondary | ICD-10-CM | POA: Insufficient documentation

## 2017-05-03 DIAGNOSIS — Z79899 Other long term (current) drug therapy: Secondary | ICD-10-CM | POA: Diagnosis not present

## 2017-05-03 LAB — URINALYSIS, COMPLETE (UACMP) WITH MICROSCOPIC
Bacteria, UA: NONE SEEN
Bilirubin Urine: NEGATIVE
Glucose, UA: NEGATIVE mg/dL
Hgb urine dipstick: NEGATIVE
Ketones, ur: NEGATIVE mg/dL
Leukocytes, UA: NEGATIVE
Nitrite: NEGATIVE
Protein, ur: NEGATIVE mg/dL
RBC / HPF: NONE SEEN RBC/hpf (ref 0–5)
Specific Gravity, Urine: 1.024 (ref 1.005–1.030)
pH: 6 (ref 5.0–8.0)

## 2017-05-03 MED ORDER — KETOROLAC TROMETHAMINE 30 MG/ML IJ SOLN
30.0000 mg | Freq: Once | INTRAMUSCULAR | Status: AC
  Administered 2017-05-03: 30 mg via INTRAMUSCULAR
  Filled 2017-05-03: qty 1

## 2017-05-03 MED ORDER — KETOROLAC TROMETHAMINE 10 MG PO TABS: 10.0000 mg | ORAL_TABLET | Freq: Four times a day (QID) | ORAL | 0 refills | Status: AC | PRN

## 2017-05-03 NOTE — ED Triage Notes (Signed)
Pt states that she has been having lower back pain. Was seen for same on 04/10/17. Pt states that she has been doing a lot of lifting at her job this past week. Pt ambulatory to triage, in NAD

## 2017-05-03 NOTE — ED Provider Notes (Signed)
[facility] Emergency Department Provider Note  ____________________________________________  Time seen: Approximately 4:07 PM  I have reviewed the triage vital signs and the nursing notes.   HISTORY  Chief Complaint Back Pain    HPI [name] is a 21 y.o. female presents to the emergency department 7 out of 10 low back pain without radiculopathy. Patient denies falls or incidences of trauma. Patient states that she has recently become more physically active at work. She denies bowel or bladder incontinence. She denies weakness or changes in sensation of the lower extremities. She has been ambulating without difficulty. No alleviating measures have been attempted.    Past Medical History:  Diagnosis Date  . Anxiety   . Depression   . Gestational diabetes   . Gestational diabetes mellitus (GDM) affecting pregnancy, antepartum 2017  . Sleep apnea     Patient Active Problem List   Diagnosis Date Noted  . Indication for care in labor or delivery 04/26/2016  . Post-operative state 04/26/2016    Past Surgical History:  Procedure Laterality Date  . CESAREAN SECTION N/A 04/26/2016   Procedure: CESAREAN SECTION;  Surgeon: Suzy Bouchard, MD;  Location: [facility] ORS;  Service: Obstetrics;  Laterality: N/A;  . TONSILLECTOMY      Prior to Admission medications   Medication Sig Start Date End Date Taking? Authorizing Provider  cyclobenzaprine (FLEXERIL) 5 MG tablet Take 1 tablet (5 mg total) by mouth 3 (three) times daily as needed. 04/10/17   Little, Traci M, PA-C  ibuprofen (ADVIL,MOTRIN) 600 MG tablet Take 1 tablet (600 mg total) by mouth every 6 (six) hours. 04/29/16   Sigmon, Scarlette Slice, CNM  ketorolac (TORADOL) 10 MG tablet Take 1 tablet (10 mg total) by mouth every 6 (six) hours as needed. 05/03/17 05/08/17  Orvil Feil, PA-C  oxyCODONE-acetaminophen (PERCOCET/ROXICET) 5-325 MG tablet Take 2 tablets by mouth every 4 (four) hours as needed (pain  scale > 7). 04/29/16   Sigmon, Scarlette Slice, CNM  predniSONE (STERAPRED UNI-PAK 21 TAB) 10 MG (21) TBPK tablet Take 6 tablets on day 1. Take 5 tablets on day 2. Take 4 tablets on day 3. Take 3 tablets on day 4. Take 2 tablets on day 5. Take 1 tablets on day 6. 04/10/17   Little, Traci M, PA-C  Prenatal Vit-Fe Fumarate-FA (PRENATAL MULTIVITAMIN) TABS tablet Take 1 tablet by mouth daily at 12 noon.    [provider]    Allergies Patient has no known allergies.  No family history on file.  Social History Social History  Substance Use Topics  . Smoking status: Former Smoker    Types: Cigarettes  . Smokeless tobacco: Former Neurosurgeon  . Alcohol use No     Review of Systems  Constitutional: No fever/chills Eyes: No visual changes. No discharge ENT: No upper respiratory complaints. Cardiovascular: no chest pain. Respiratory: no cough. No SOB. Gastrointestinal: No abdominal pain.  No nausea, no vomiting.  No diarrhea.  No constipation. Genitourinary: Negative for dysuria. No hematuria Musculoskeletal: Patient has low back pain.  Skin: Negative for rash, abrasions, lacerations, ecchymosis. Neurological: Negative for headaches, focal weakness or numbness. .  ____________________________________________   PHYSICAL EXAM:  VITAL SIGNS: ED Triage Vitals  Enc Vitals Group     BP 05/03/17 1518 (!) 125/59     Pulse Rate 05/03/17 1516 90     Resp 05/03/17 1516 16     Temp 05/03/17 1516 98.4 F (36.9 C)     Temp Source 05/03/17  1516 Oral     SpO2 05/03/17 1516 96 %     Weight --      Height --      Head Circumference --      Peak Flow --      Pain Score 05/03/17 1516 3     Pain Loc --      Pain Edu? --      Excl. in GC? --      Constitutional: Alert and oriented. Well appearing and in no acute distress. Eyes: Conjunctivae are normal. PERRL. EOMI. Head: Atraumatic. Cardiovascular: Normal rate, regular rhythm. Normal S1 and S2.  Good peripheral circulation. Respiratory:  Normal respiratory effort without tachypnea or retractions. Lungs CTAB. Good air entry to the bases with no decreased or absent breath sounds. Gastrointestinal: Bowel sounds 4 quadrants. Soft and nontender to palpation. No guarding or rigidity. No palpable masses. No distention. No CVA tenderness. Musculoskeletal: Patient has 5/5 strength in the upper and lower extremities bilaterally. Full range of motion at the shoulder, elbow and wrist bilaterally. Full range of motion at the hip, knee and ankle bilaterally. No changes in gait. Negative straight leg raise bilaterally. Mild paraspinal muscle tenderness along the lumbar spine. Palpable dorsalis pedis pulse bilaterally and symmetrically. Neurologic:  Normal speech and language. No gross focal neurologic deficits are appreciated.  Skin:  Skin is warm, dry and intact. No rash noted. Psychiatric: Mood and affect are normal. Speech and behavior are normal. Patient exhibits appropriate insight and judgement.   ____________________________________________   LABS (all labs ordered are listed, but only abnormal results are displayed)  Labs Reviewed  URINALYSIS, COMPLETE (UACMP) WITH MICROSCOPIC - Abnormal; Notable for the following:       Result Value   Color, Urine YELLOW (*)    APPearance CLEAR (*)    Squamous Epithelial / LPF 0-5 (*)    All other components within normal limits   ____________________________________________  EKG   ____________________________________________  RADIOLOGY   No results found.  ____________________________________________    PROCEDURES  Procedure(s) performed:    Procedures    Medications  ketorolac (TORADOL) 30 MG/ML injection 30 mg (30 mg Intramuscular Given 05/03/17 1814)     ____________________________________________   INITIAL IMPRESSION / ASSESSMENT AND PLAN / ED COURSE  Pertinent labs & imaging results that were available during my care of the patient were reviewed by me and  considered in my medical decision making (see chart for details).  Review of the [place] CSRS was performed in accordance of the [facility] prior to dispensing any controlled drugs.     Assessment and Plan:  Low back pain Patient presents to the emergency department with bilateral low back pain the patient has noticed after increased physical activity at work. Urinalysis was reassuring. Patient was given an injection of Toradol and discharged with toradol. Vital signs were reassuring prior to discharge.    ____________________________________________  FINAL CLINICAL IMPRESSION(S) / ED DIAGNOSES  Final diagnoses:  Acute bilateral low back pain without sciatica      NEW MEDICATIONS STARTED DURING THIS VISIT:  Discharge Medication List as of 05/03/2017  6:08 PM    START taking these medications   Details  ketorolac (TORADOL) 10 MG tablet Take 1 tablet (10 mg total) by mouth every 6 (six) hours as needed., Starting Fri 05/03/2017, Until Wed 05/08/2017, Print            This chart was dictated using voice recognition software/Dragon. Despite best efforts to proofread, errors can occur which  can change the meaning. Any change was purely unintentional.    Orvil Feil, PA-C 05/03/17 Jackquline Berlin, MD 05/04/17 [phone]

## 2018-06-15 IMAGING — US US OB FOLLOW-UP
1 series · 13 of 28 positions shown · non-contrast
Comparison: none

CLINICAL DATA: Pregnancy.

EXAM:
OBSTETRIC 14+ WK ULTRASOUND FOLLOW-UP

[Series 1: us ob follow-up · 0.22mm/px · 13 of 52 slices shown]
[im 2/52]
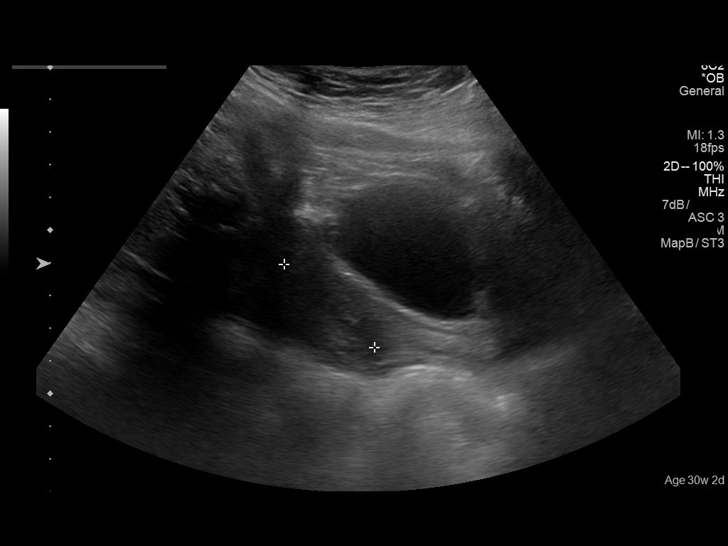
[im 6/52]
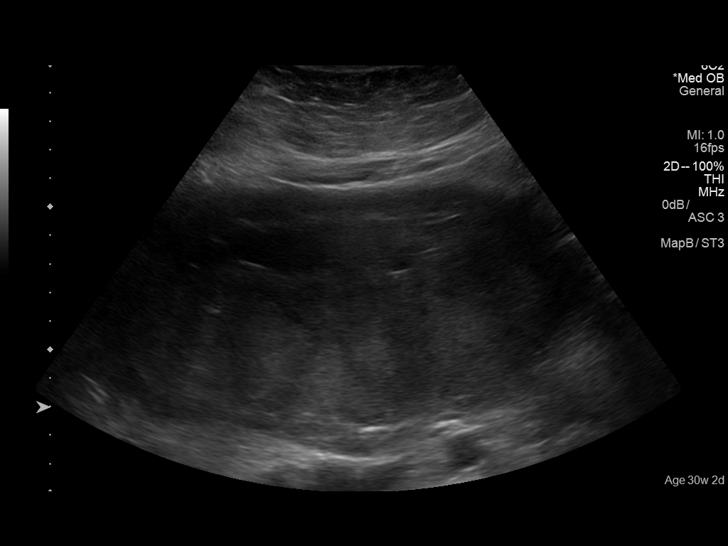
[im 10/52]
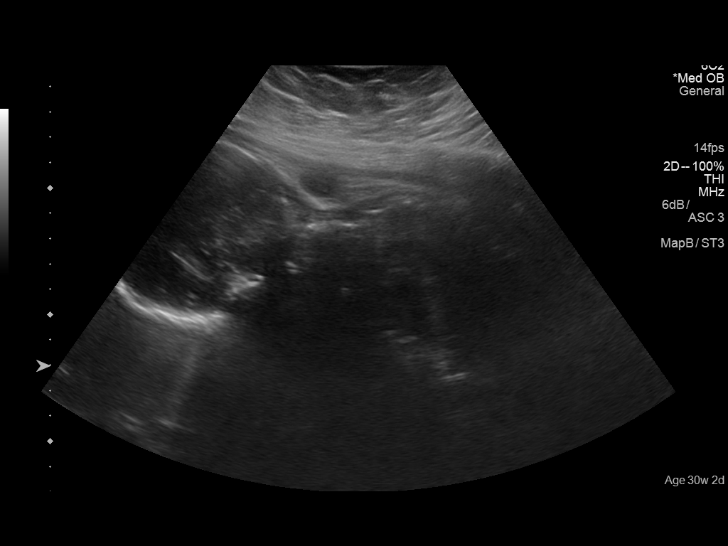
[im 14/52]
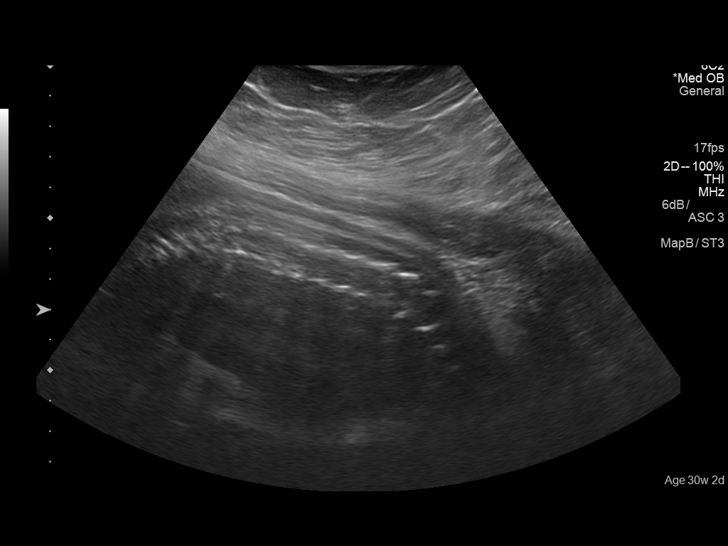
[im 18/52]
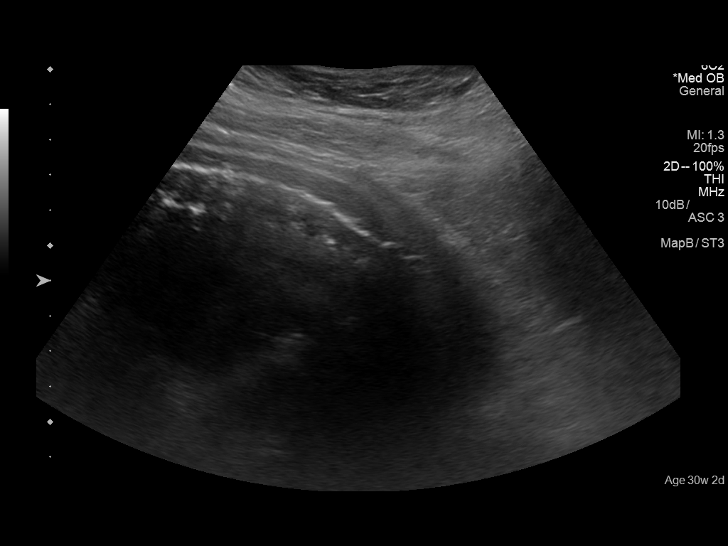
[im 21/52]
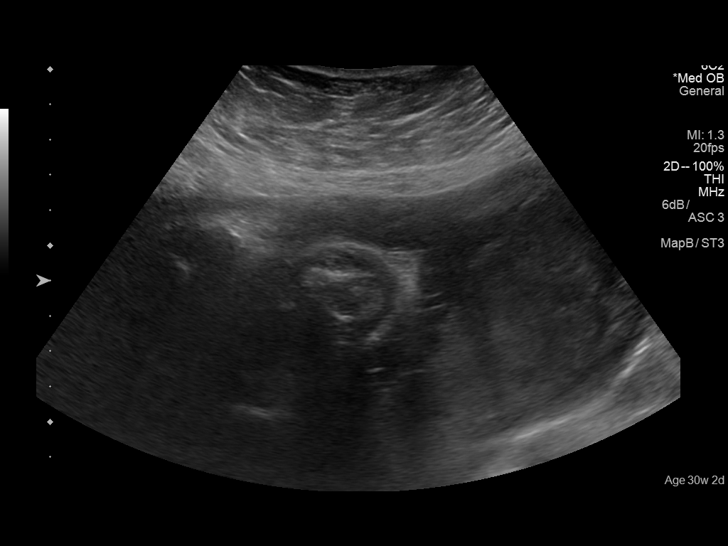
[im 27/52]
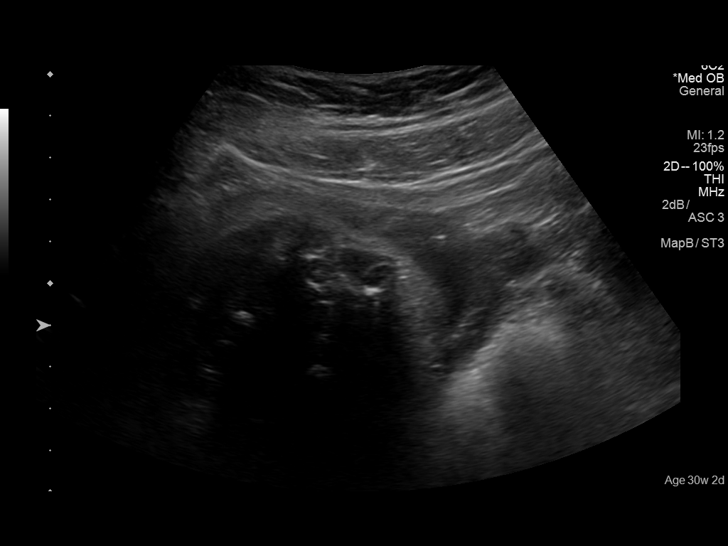
[im 31/52]
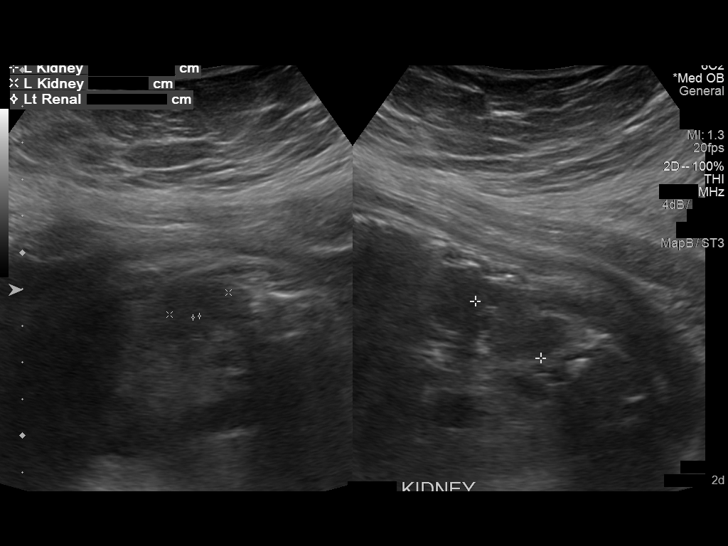
[im 35/52]
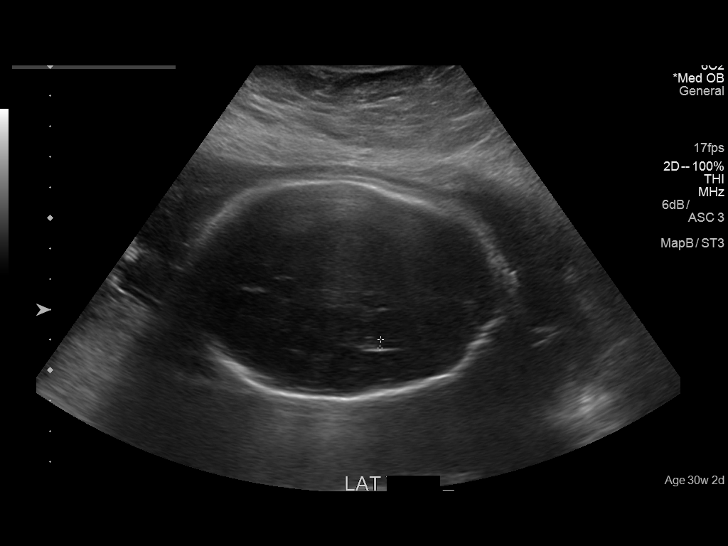
[im 38/52]
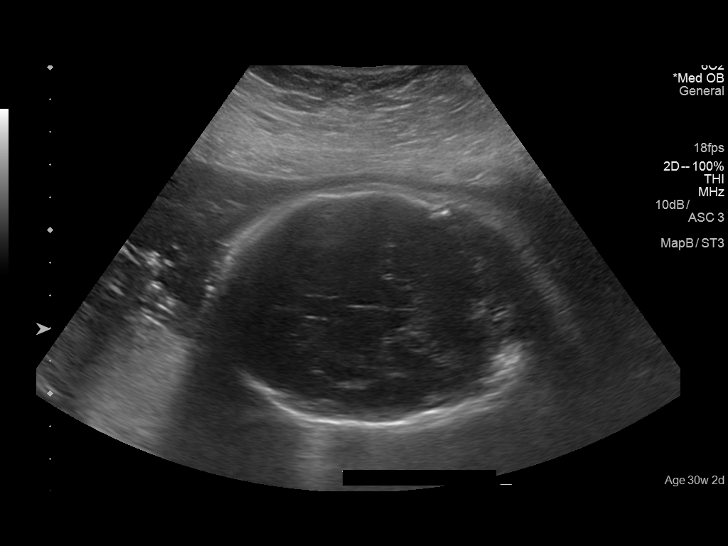
[im 42/52]
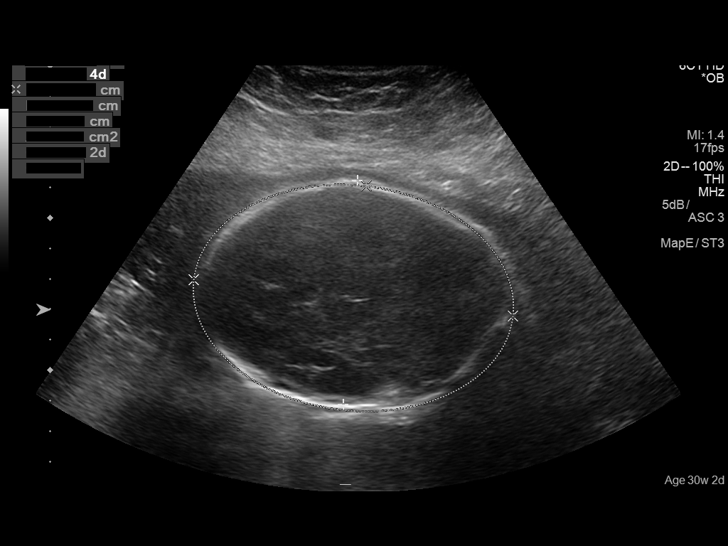
[im 46/52]
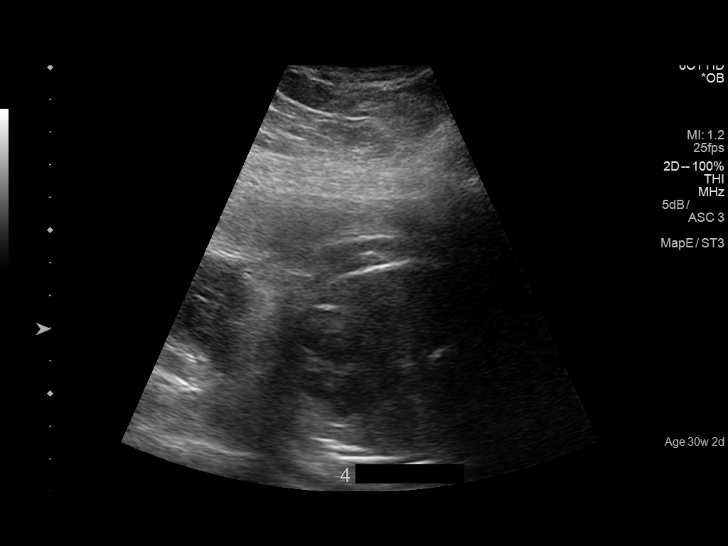
[im 50/52]
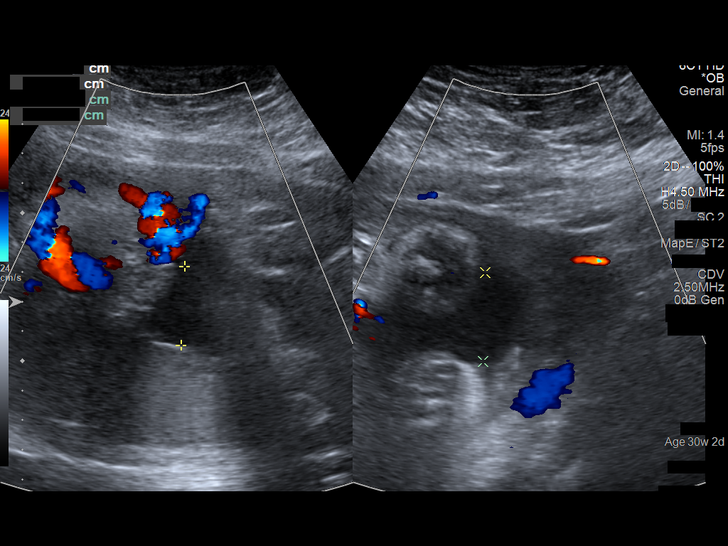

[13 of 28 positions shown; findings below may reference images not displayed]

FINDINGS: Comparison made to prior study 12/30/2015.

Number of Fetuses: 1

Heart Rate:  152 bpm

Movement: Present

Presentation: Breech

Previa: No

Placental Location: Posterior

Amniotic Fluid (Subjective): Decreased

Amniotic Fluid (Objective):

AFI 7.8 cm (5%ile= 9.0 cm, 95%= 23.4 cm for 30 wks)

FETAL BIOMETRY

BPD:  7.2cm 29w   0d

HC:    28.8cm  31w   5d

AC:   27.3cm  31w   2d

FL:   5.7cm  30w   1d

Current Mean GA: 30Klpigbb [place]          US EDC: 05/19/2016

Assigned GA:  30w 2d              Assigned EDC:  05/21/2016

Estimated Fetal Weight:  1637g 55%ile

FETAL ANATOMY

Lateral Ventricles: Visualized

Thalami/CSP: Visualized

Posterior Fossa:  Previously visualized

Nuchal Region: Previously visualized

Upper Lip: Previously visualized

Spine: Visualized

4 Chamber Heart on Left: Visualize

LVOT: Previously visualized

RVOT: Previously visualized

Stomach on Left: Visualized

3 Vessel Cord: Previously visualized

Cord Insertion site: Previously visualized

Kidneys: Visualized

Bladder: Visualized

Extremities: Previously visualized

Technically difficult due to: Maternal body habitus and low amniotic
fluid.

MATERNAL FINDINGS:

Cervix: 3.8 cm and closed.
IMPRESSION: 1. Single viable intrauterine pregnancy at 30 weeks 2 days. Fetal
presentation breech.

2. Low amniotic fluid volume. Patient sent to emergency room for
further evaluation.

## 2018-09-22 ENCOUNTER — Encounter: Payer: Self-pay | Admitting: Emergency Medicine

## 2018-09-22 ENCOUNTER — Emergency Department
Admission: EM | Admit: 2018-09-22 | Discharge: 2018-09-22 | Disposition: A | Discharge status: Home / Self Care | Payer: Medicaid Other | Attending: Emergency Medicine | Admitting: Emergency Medicine

## 2018-09-22 ENCOUNTER — Other Ambulatory Visit: Payer: Self-pay

## 2018-09-22 ENCOUNTER — Emergency Department: Payer: Medicaid Other

## 2018-09-22 DIAGNOSIS — F17210 Nicotine dependence, cigarettes, uncomplicated: Secondary | ICD-10-CM | POA: Insufficient documentation

## 2018-09-22 DIAGNOSIS — R1110 Vomiting, unspecified: Secondary | ICD-10-CM | POA: Insufficient documentation

## 2018-09-22 DIAGNOSIS — R112 Nausea with vomiting, unspecified: Secondary | ICD-10-CM

## 2018-09-22 LAB — CBC
HEMATOCRIT: 44.7 % (ref 36.0–46.0)
Hemoglobin: 15 g/dL (ref 12.0–15.0)
MCH: 28.5 pg (ref 26.0–34.0)
MCHC: 33.6 g/dL (ref 30.0–36.0)
MCV: 84.8 fL (ref 80.0–100.0)
Platelets: 328 10*3/uL (ref 150–400)
RBC: 5.27 MIL/uL — ABNORMAL HIGH (ref 3.87–5.11)
RDW: 12.2 % (ref 11.5–15.5)
WBC: 12.8 10*3/uL — ABNORMAL HIGH (ref 4.0–10.5)
nRBC: 0 % (ref 0.0–0.2)

## 2018-09-22 LAB — COMPREHENSIVE METABOLIC PANEL
ALT: 28 U/L (ref 0–44)
AST: 21 U/L (ref 15–41)
Albumin: 4.2 g/dL (ref 3.5–5.0)
Alkaline Phosphatase: 56 U/L (ref 38–126)
Anion gap: 7 (ref 5–15)
BUN: 15 mg/dL (ref 6–20)
CHLORIDE: 102 mmol/L (ref 98–111)
CO2: 27 mmol/L (ref 22–32)
Calcium: 8.9 mg/dL (ref 8.9–10.3)
Creatinine, Ser: 0.75 mg/dL (ref 0.44–1.00)
GFR calc Af Amer: >60 mL/min (ref >60)
GFR calc non Af Amer: >60 mL/min (ref >60)
Glucose, Bld: 108 mg/dL — ABNORMAL HIGH (ref 70–99)
Potassium: 3.8 mmol/L (ref 3.5–5.1)
Sodium: 136 mmol/L (ref 135–145)
Total Bilirubin: 1.1 mg/dL (ref 0.3–1.2)
Total Protein: 7.6 g/dL (ref 6.5–8.1)

## 2018-09-22 LAB — URINALYSIS, COMPLETE (UACMP) WITH MICROSCOPIC
BILIRUBIN URINE: NEGATIVE
Glucose, UA: NEGATIVE mg/dL
Hgb urine dipstick: NEGATIVE
Ketones, ur: NEGATIVE mg/dL
LEUKOCYTE UA: NEGATIVE
Nitrite: NEGATIVE
Protein, ur: NEGATIVE mg/dL
Specific Gravity, Urine: 1.025 (ref 1.005–1.030)
pH: 5 (ref 5.0–8.0)

## 2018-09-22 LAB — LIPASE, BLOOD: LIPASE: 24 U/L (ref 11–51)

## 2018-09-22 LAB — POCT PREGNANCY, URINE: Preg Test, Ur: NEGATIVE

## 2018-09-22 MED ORDER — SODIUM CHLORIDE 0.9% FLUSH: 3.0000 mL | Freq: Once | INTRAVENOUS | Status: DC

## 2018-09-22 MED ORDER — PROMETHAZINE HCL 25 MG PO TABS
50.0000 mg | ORAL_TABLET | Freq: Once | ORAL | Status: AC
  Administered 2018-09-22: 50 mg via ORAL
  Filled 2018-09-22: qty 2

## 2018-09-22 MED ORDER — FAMOTIDINE 20 MG PO TABS: 20.0000 mg | ORAL_TABLET | Freq: Two times a day (BID) | ORAL | 1 refills | Status: DC

## 2018-09-22 MED ORDER — PROMETHAZINE HCL 25 MG PO TABS: 25.0000 mg | ORAL_TABLET | Freq: Four times a day (QID) | ORAL | 0 refills | Status: DC | PRN

## 2018-09-22 NOTE — ED Provider Notes (Signed)
[facility] Emergency Department Provider Note       Time seen: ----------------------------------------- 2:15 PM on 09/22/2018 -----------------------------------------   I have reviewed the triage vital signs and the nursing notes.  HISTORY   Chief Complaint Emesis    HPI [name] is a 23 y.o. female with a history of anxiety, depression, gestational diabetes, sleep apnea who presents to the ED for vomiting.  Patient states she has had persistent vomiting over the past week.  She does have nausea during the day and vomiting in the morning and evening.  Home pregnancy test were negative, she denies any recent illness, has had some joint pains but otherwise denies flu symptoms.  Past Medical History:  Diagnosis Date  . Anxiety   . Depression   . Gestational diabetes   . Gestational diabetes mellitus (GDM) affecting pregnancy, antepartum 2017  . Sleep apnea     Patient Active Problem List   Diagnosis Date Noted  . Indication for care in labor or delivery 04/26/2016  . Post-operative state 04/26/2016    Past Surgical History:  Procedure Laterality Date  . CESAREAN SECTION N/A 04/26/2016   Procedure: CESAREAN SECTION;  Surgeon: Suzy Bouchard, MD;  Location: [facility] ORS;  Service: Obstetrics;  Laterality: N/A;  . TONSILLECTOMY      Allergies Patient has no known allergies.  Social History Social History   Tobacco Use  . Smoking status: Former Smoker    Types: Cigarettes  . Smokeless tobacco: Former Engineer, water Use Topics  . Alcohol use: No  . Drug use: No   Review of Systems Constitutional: Negative for fever.  Positive for joint aches Cardiovascular: Negative for chest pain. Respiratory: Negative for shortness of breath. Gastrointestinal: Negative for abdominal pain, positive for vomiting Musculoskeletal: Negative for back pain. Skin: Negative for rash. Neurological: Negative for headaches, focal weakness or  numbness.  All systems negative/normal/unremarkable except as stated in the HPI  ____________________________________________   PHYSICAL EXAM:  VITAL SIGNS: ED Triage Vitals  Enc Vitals Group     BP 09/22/18 1158 127/84     Pulse Rate 09/22/18 1158 (!) 113     Resp 09/22/18 1158 16     Temp 09/22/18 1158 98.4 F (36.9 C)     Temp Source 09/22/18 1158 Oral     SpO2 09/22/18 1158 97 %     Weight 09/22/18 1159 274 lb (124.3 kg)     Height 09/22/18 1159 5\' 4"  (1.626 m)     Head Circumference --      Peak Flow --      Pain Score 09/22/18 1159 0     Pain Loc --      Pain Edu? --      Excl. in GC? --    Constitutional: Alert and oriented. Well appearing and in no distress. Cardiovascular: Normal rate, regular rhythm. No murmurs, rubs, or gallops. Respiratory: Normal respiratory effort without tachypnea nor retractions. Breath sounds are clear and equal bilaterally. No wheezes/rales/rhonchi. Gastrointestinal: Soft and nontender. Normal bowel sounds Musculoskeletal: Nontender with normal range of motion in extremities. No lower extremity tenderness nor edema. Neurologic:  Normal speech and language. No gross focal neurologic deficits are appreciated.  Skin:  Skin is warm, dry and intact. No rash noted. Psychiatric: Mood and affect are normal. Speech and behavior are normal.  ____________________________________________  ED COURSE:  As part of my medical decision making, I reviewed the following data within the electronic MEDICAL RECORD NUMBER History obtained from  family if available, nursing notes, old chart and ekg, as well as notes from prior ED visits. Patient presented for vomiting, we will assess with labs and imaging as indicated at this time.   Procedures ____________________________________________   LABS (pertinent positives/negatives)  Labs Reviewed  COMPREHENSIVE METABOLIC PANEL - Abnormal; Notable for the following components:      Result Value   Glucose, Bld 108 (*)     All other components within normal limits  CBC - Abnormal; Notable for the following components:   WBC 12.8 (*)    RBC 5.27 (*)    All other components within normal limits  URINALYSIS, COMPLETE (UACMP) WITH MICROSCOPIC - Abnormal; Notable for the following components:   Color, Urine YELLOW (*)    APPearance CLOUDY (*)    Bacteria, UA RARE (*)    All other components within normal limits  LIPASE, BLOOD  POC URINE PREG, ED  POCT PREGNANCY, URINE   ___________________________________________   DIFFERENTIAL DIAGNOSIS   Anxiety, GERD, gallbladder dysfunction, gastroenteritis, gastritis  FINAL ASSESSMENT AND PLAN  Vomiting   Plan: The patient had presented for persistent vomiting. Patient's labs were grossly unremarkable, she appears to have chronic leukocytosis. Patient's imaging did reveal gallstones but no signs of cholecystitis.  She will be discharged with antiemetics and will be referred to GI for close outpatient follow-up.   Ulice Dash, MD    Note: This note was generated in part or whole with voice recognition software. Voice recognition is usually quite accurate but there are transcription errors that can and very often do occur. I apologize for any typographical errors that were not detected and corrected.     Emily Filbert, MD 09/22/18 [phone]

## 2018-09-22 NOTE — ED Triage Notes (Signed)
Says she has been vomiitng in am and eve for a week.  Says she does have nausea during day.  Says she has done preg tests at home and they have been negative.

## 2019-01-31 ENCOUNTER — Encounter: Payer: Self-pay | Admitting: Emergency Medicine

## 2019-01-31 ENCOUNTER — Emergency Department: Payer: Medicaid Other

## 2019-01-31 ENCOUNTER — Other Ambulatory Visit: Payer: Self-pay

## 2019-01-31 ENCOUNTER — Emergency Department
Admission: EM | Admit: 2019-01-31 | Discharge: 2019-01-31 | Disposition: A | Discharge status: Home / Self Care | Payer: Medicaid Other | Attending: Emergency Medicine | Admitting: Emergency Medicine

## 2019-01-31 DIAGNOSIS — Z79899 Other long term (current) drug therapy: Secondary | ICD-10-CM | POA: Insufficient documentation

## 2019-01-31 DIAGNOSIS — K8071 Calculus of gallbladder and bile duct without cholecystitis with obstruction: Secondary | ICD-10-CM | POA: Insufficient documentation

## 2019-01-31 DIAGNOSIS — K8020 Calculus of gallbladder without cholecystitis without obstruction: Secondary | ICD-10-CM

## 2019-01-31 DIAGNOSIS — K8050 Calculus of bile duct without cholangitis or cholecystitis without obstruction: Secondary | ICD-10-CM

## 2019-01-31 DIAGNOSIS — R109 Unspecified abdominal pain: Secondary | ICD-10-CM

## 2019-01-31 DIAGNOSIS — Z87891 Personal history of nicotine dependence: Secondary | ICD-10-CM | POA: Insufficient documentation

## 2019-01-31 LAB — URINALYSIS, COMPLETE (UACMP) WITH MICROSCOPIC
Bacteria, UA: NONE SEEN
Bilirubin Urine: NEGATIVE
Glucose, UA: NEGATIVE mg/dL
Hgb urine dipstick: NEGATIVE
Ketones, ur: NEGATIVE mg/dL
Leukocytes,Ua: NEGATIVE
Nitrite: NEGATIVE
Protein, ur: 30 mg/dL — AB
Specific Gravity, Urine: 1.026 (ref 1.005–1.030)
pH: 5 (ref 5.0–8.0)

## 2019-01-31 LAB — COMPREHENSIVE METABOLIC PANEL
ALT: 22 U/L (ref 0–44)
AST: 19 U/L (ref 15–41)
Albumin: 4 g/dL (ref 3.5–5.0)
Alkaline Phosphatase: 59 U/L (ref 38–126)
Anion gap: 7 (ref 5–15)
BUN: 14 mg/dL (ref 6–20)
CO2: 28 mmol/L (ref 22–32)
Calcium: 8.8 mg/dL — ABNORMAL LOW (ref 8.9–10.3)
Chloride: 103 mmol/L (ref 98–111)
Creatinine, Ser: 0.82 mg/dL (ref 0.44–1.00)
GFR calc Af Amer: >60 mL/min (ref >60)
GFR calc non Af Amer: >60 mL/min (ref >60)
Glucose, Bld: 149 mg/dL — ABNORMAL HIGH (ref 70–99)
Potassium: 3.9 mmol/L (ref 3.5–5.1)
Sodium: 138 mmol/L (ref 135–145)
Total Bilirubin: 0.4 mg/dL (ref 0.3–1.2)
Total Protein: 7.5 g/dL (ref 6.5–8.1)

## 2019-01-31 LAB — CBC
HCT: 42.3 % (ref 36.0–46.0)
Hemoglobin: 14 g/dL (ref 12.0–15.0)
MCH: 28.8 pg (ref 26.0–34.0)
MCHC: 33.1 g/dL (ref 30.0–36.0)
MCV: 87 fL (ref 80.0–100.0)
Platelets: 314 10*3/uL (ref 150–400)
RBC: 4.86 MIL/uL (ref 3.87–5.11)
RDW: 12 % (ref 11.5–15.5)
WBC: 10.4 10*3/uL (ref 4.0–10.5)
nRBC: 0 % (ref 0.0–0.2)

## 2019-01-31 LAB — TROPONIN I (HIGH SENSITIVITY): Troponin I (High Sensitivity): <2 ng/L (ref <18)

## 2019-01-31 LAB — LIPASE, BLOOD: Lipase: 32 U/L (ref 11–51)

## 2019-01-31 LAB — POCT PREGNANCY, URINE: Preg Test, Ur: NEGATIVE

## 2019-01-31 MED ORDER — MORPHINE SULFATE (PF) 4 MG/ML IV SOLN
4.0000 mg | Freq: Once | INTRAVENOUS | Status: AC
  Administered 2019-01-31: 09:00:00 4 mg via INTRAVENOUS
  Filled 2019-01-31: qty 1

## 2019-01-31 MED ORDER — SODIUM CHLORIDE 0.9% FLUSH
3.0000 mL | Freq: Once | INTRAVENOUS | Status: AC
  Administered 2019-01-31: 09:00:00 3 mL via INTRAVENOUS

## 2019-01-31 MED ORDER — HYDROCODONE-ACETAMINOPHEN 5-325 MG PO TABS: 1.0000 | ORAL_TABLET | Freq: Four times a day (QID) | ORAL | 0 refills | Status: DC | PRN

## 2019-01-31 MED ORDER — ONDANSETRON HCL 4 MG/2ML IJ SOLN
4.0000 mg | Freq: Once | INTRAMUSCULAR | Status: AC
  Administered 2019-01-31: 4 mg via INTRAVENOUS
  Filled 2019-01-31: qty 2

## 2019-01-31 NOTE — Discharge Instructions (Signed)
Follow-up with general surgery.  No driving today or while taking hydrocodone.  Please return to the emergency room right away if you are to develop a fever, severe nausea, your pain becomes severe or worsens, you are unable to keep food down, begin vomiting any dark or bloody fluid, you develop any dark or bloody stools, feel dehydrated, or other new concerns or symptoms arise.

## 2019-01-31 NOTE — ED Provider Notes (Signed)
[facility] Emergency Department Provider Note ____________________________________________   First MD Initiated Contact with Patient 01/31/19 [phone]     (approximate)  I have reviewed the triage vital signs and the nursing notes.  HISTORY  Chief Complaint Abdominal Pain  HPI [name] is a 23 y.o. female awoke at about 5 in the morning with severe pain under her right ribs that radiates slightly towards the middle of her abdomen.  Radiates slightly to her back.  Associated with nausea and 2 episodes of vomiting.  Has a persisting pain moderate located in the same area.  Denies pregnancy.  No lower abdominal pain.  Previous C-section once.  Was told that she had a gallstone she reports her mom thought she better come get checked out make sure no gallbladder problem today.  No recent fevers or chills.  No diarrhea.  No history of NSAID use.  No blood in her vomit but she reports it was slightly darkish in color.     Past Medical History:  Diagnosis Date  . Anxiety   . Depression   . Gestational diabetes   . Gestational diabetes mellitus (GDM) affecting pregnancy, antepartum 2017  . Sleep apnea     Patient Active Problem List   Diagnosis Date Noted  . Indication for care in labor or delivery 04/26/2016  . Post-operative state 04/26/2016    Past Surgical History:  Procedure Laterality Date  . CESAREAN SECTION N/A 04/26/2016   Procedure: CESAREAN SECTION;  Surgeon: Boykin Nearing, MD;  Location: [facility] ORS;  Service: Obstetrics;  Laterality: N/A;  . TONSILLECTOMY      Prior to Admission medications   Medication Sig Start Date End Date Taking? Authorizing Provider  famotidine (PEPCID) 20 MG tablet Take 1 tablet (20 mg total) by mouth 2 (two) times daily. 09/22/18   Earleen Newport, MD  HYDROcodone-acetaminophen (NORCO/VICODIN) 5-325 MG tablet Take 1-2 tablets by mouth every 6 (six) hours as needed for moderate pain or severe pain. 01/31/19    Delman Kitten, MD  Prenatal Vit-Fe Fumarate-FA (PRENATAL MULTIVITAMIN) TABS tablet Take 1 tablet by mouth daily at 12 noon.    [provider]  promethazine (PHENERGAN) 25 MG tablet Take 1 tablet (25 mg total) by mouth every 6 (six) hours as needed for nausea or vomiting. 09/22/18   Earleen Newport, MD    Allergies Patient has no known allergies.  No family history on file.  Social History Social History   Tobacco Use  . Smoking status: Former Smoker    Types: Cigarettes  . Smokeless tobacco: Former Network engineer Use Topics  . Alcohol use: No  . Drug use: No    Review of Systems Constitutional: No fever/chills or exposure to coronavirus Eyes: No visual changes. ENT: No sore throat. Cardiovascular: Denies chest pain. Respiratory: Denies shortness of breath. Gastrointestinal: See HPI Genitourinary: Negative for dysuria. Musculoskeletal: Negative for back pain. Skin: Negative for rash. Neurological: Negative for headaches, areas of focal weakness or numbness.    ____________________________________________   PHYSICAL EXAM:  VITAL SIGNS: ED Triage Vitals  Enc Vitals Group     BP 01/31/19 0751 (!) 127/97     Pulse Rate 01/31/19 0751 88     Resp --      Temp 01/31/19 0751 98.5 F (36.9 C)     Temp Source 01/31/19 0751 Oral     SpO2 01/31/19 0751 99 %     Weight 01/31/19 0745 273 lb (123.8 kg)  Height 01/31/19 0745 5\' 4"  (1.626 m)     Head Circumference --      Peak Flow --      Pain Score 01/31/19 0744 8     Pain Loc --      Pain Edu? --      Excl. in GC? --     Constitutional: Alert and oriented. Well appearing and in no acute distress.  He is very pleasant. Eyes: Conjunctivae are normal. Head: Atraumatic. Nose: No congestion/rhinnorhea. Mouth/Throat: Mucous membranes are moist. Neck: No stridor.  Cardiovascular: Normal rate, regular rhythm. Grossly normal heart sounds.  Good peripheral circulation. Respiratory: Normal respiratory  effort.  No retractions. Lungs CTAB. Gastrointestinal: Soft and positive Murphy, moderate discomfort right upper quadrant.  No pain or discomfort left side of the abdomen.  No pain McBurney's point.  No suprapubic tenderness.. No distention. Musculoskeletal: No lower extremity tenderness nor edema. Neurologic:  Normal speech and language. No gross focal neurologic deficits are appreciated.  Skin:  Skin is warm, dry and intact. No rash noted. Psychiatric: Mood and affect are normal. Speech and behavior are normal.  ____________________________________________   LABS (all labs ordered are listed, but only abnormal results are displayed)  Labs Reviewed  COMPREHENSIVE METABOLIC PANEL - Abnormal; Notable for the following components:      Result Value   Glucose, Bld 149 (*)    Calcium 8.8 (*)    All other components within normal limits  URINALYSIS, COMPLETE (UACMP) WITH MICROSCOPIC - Abnormal; Notable for the following components:   Color, Urine YELLOW (*)    APPearance CLOUDY (*)    Protein, ur 30 (*)    All other components within normal limits  LIPASE, BLOOD  CBC  TROPONIN I (HIGH SENSITIVITY)  POCT PREGNANCY, URINE  POC URINE PREG, ED   ____________________________________________  EKG  ED ECG REPORT I, Sharyn CreamerMark Adair Lauderback, the attending physician, personally viewed and interpreted this ECG.  Date: 01/31/2019 EKG Time: 820 Rate: 95 Rhythm: normal sinus rhythm QRS Axis: normal Intervals: normal ST/T Wave abnormalities: normal Narrative Interpretation: no evidence of acute ischemia  ____________________________________________  RADIOLOGY  [place] Abdomen Limited Ruq  Result Date: 01/31/2019 CLINICAL DATA:  Abdominal pain for 4 hours EXAM: ULTRASOUND ABDOMEN LIMITED RIGHT UPPER QUADRANT COMPARISON:  09/22/2018 FINDINGS: Gallbladder: Gallstone fixed in the gallbladder neck and measuring 15 mm. The gallbladder is full but there is no wall thickening or pericholecystic edema. Negative  sonographic Murphy sign. Common bile duct: Diameter: 5 mm.  Where visualized, no filling defect. Liver: No focal lesion identified. Borderline generalized increased echogenicity. Portal vein is patent on color Doppler imaging with normal direction of blood flow towards the liver. IMPRESSION: 1. Gallstone fixed in the gallbladder neck. Gallbladder is full but there is no inflammatory change typical of acute cholecystitis. 2. Possible hepatic steatosis. Electronically Signed   By: Marnee SpringJonathon  Watts M.D.   On: 01/31/2019 08:35    Ultrasound reviewed, gallstone in the neck no evidence of inflammatory changes noted. ____________________________________________   PROCEDURES  Procedure(s) performed: None  Procedures  Critical Care performed: No  ____________________________________________   INITIAL IMPRESSION / ASSESSMENT AND PLAN / ED COURSE  Pertinent labs & imaging results that were available during my care of the patient were reviewed by me and considered in my medical decision making (see chart for details).   Differential diagnosis includes but is not limited to, abdominal perforation, aortic dissection, cholecystitis, appendicitis, diverticulitis, colitis, esophagitis/gastritis, kidney stone, pyelonephritis, urinary tract infection, aortic aneurysm. All are considered in  decision and treatment plan. Based upon the patient's presentation and risk factors, I am concerned about potential biliary etiology with her initial clinical evaluation.  Start with ultrasound, antiemetic and pain medicine after ultrasound.  Denies gynecologic symptoms.  No vaginal bleeding.  Denies pelvic pain.  No lower abdominal pain.  No signs or symptoms suggest acute appendicitis.  No urinary symptoms     ----------------------------------------- 9:29 AM on 01/31/2019 -----------------------------------------  Patient completely pain-free now.  Reports all symptoms resolved.  On reexam no ongoing abdominal  tenderness.  Negative Eulah PontMurphy now.  Appears consistent with gallstone attack, biliary colic.  Placed request to Dr. Aleen CampiPiscoya for close outpatient follow-up.  Patient comfortable with plan for discharge and careful return precautions with discussed with her. Return precautions and treatment recommendations and follow-up discussed with the patient who is agreeable with the plan.  I will prescribe the patient a narcotic pain medicine due to their condition which I anticipate will cause at least moderate pain short term. I discussed with the patient safe use of narcotic pain medicines, and that they are not to drive, work in dangerous areas, or ever take more than prescribed (no more than 1-2 pill every 6 hours). We discussed that this is the type of medication that can be  overdosed on and the risks of this type of medicine. Patient is very agreeable to only use as prescribed and to never use more than prescribed.   Patient family driving her home.  Currently pain-free.  Tolerating by mouth without difficulty. ____________________________________________   FINAL CLINICAL IMPRESSION(S) / ED DIAGNOSES  Final diagnoses:  Abdominal pain  Biliary colic  Gallstones        Note:  This document was prepared using Dragon voice recognition software and may include unintentional dictation errors       Sharyn CreamerQuale, Vaden Becherer, MD 01/31/19 [phone]

## 2019-01-31 NOTE — ED Triage Notes (Signed)
Pt to ED via POV c/o RUQ abdominal pain. Pt states that the pain started this morning around 0500. Pt has vomited twice. Pt reports that the pain radiates around to the top of her back. Pt still has Gallbladder. Pt is in NAD.

## 2019-02-02 ENCOUNTER — Telehealth: Payer: Self-pay | Admitting: Surgery

## 2019-02-02 NOTE — Telephone Encounter (Signed)
Unable to leave a message for the patient to call the office, patient needs an appointment to be seen by Dr. Hampton Abbot patient was seen in the  ED for gallbladder pain.

## 2019-02-08 ENCOUNTER — Other Ambulatory Visit: Payer: Self-pay

## 2019-02-08 ENCOUNTER — Encounter: Payer: Self-pay | Admitting: Emergency Medicine

## 2019-02-08 ENCOUNTER — Emergency Department
Admission: EM | Admit: 2019-02-08 | Discharge: 2019-02-08 | Disposition: A | Discharge status: Home / Self Care | Payer: Medicaid Other | Attending: Emergency Medicine | Admitting: Emergency Medicine

## 2019-02-08 DIAGNOSIS — J069 Acute upper respiratory infection, unspecified: Secondary | ICD-10-CM | POA: Insufficient documentation

## 2019-02-08 DIAGNOSIS — Z87891 Personal history of nicotine dependence: Secondary | ICD-10-CM | POA: Diagnosis not present

## 2019-02-08 DIAGNOSIS — J029 Acute pharyngitis, unspecified: Secondary | ICD-10-CM | POA: Diagnosis present

## 2019-02-08 LAB — GROUP A STREP BY PCR: Group A Strep by PCR: NOT DETECTED

## 2019-02-08 MED ORDER — FEXOFENADINE-PSEUDOEPHED ER 60-120 MG PO TB12: 1.0000 | ORAL_TABLET | Freq: Two times a day (BID) | ORAL | 0 refills | Status: DC

## 2019-02-08 NOTE — ED Notes (Signed)
See triage note  Presents with allergy sxs' which started about 1 week ago  Then developed fever and sore throat yesterday  Low grade fever noted on arrival

## 2019-02-08 NOTE — ED Triage Notes (Signed)
Pt arrived via POV with reports of HA, body ache, sore throat, fever since yesterday. Pt has been taking benadryl.  Pt denies any sick contacts.

## 2019-02-08 NOTE — ED Provider Notes (Signed)
[facility] Emergency Department Provider Note  ___________________________________________   First MD Initiated Contact with Patient 02/08/19 1118     (approximate)  I have reviewed the triage vital signs and the nursing notes.   HISTORY  Chief Complaint Sore Throat and Fever   HPI [name] is a 23 y.o. female presents to the ED with complaint of allergy symptoms that started a week ago however she developed fever and sore throat yesterday.  Patient denies any known exposure to COVID.  She reports a headache, body ache, sore throat and fever starting yesterday.  She has been taking Benadryl with minimal relief.  She rates her pain as a 9/10.     Past Medical History:  Diagnosis Date  . Anxiety   . Depression   . Gestational diabetes   . Gestational diabetes mellitus (GDM) affecting pregnancy, antepartum 2017  . Sleep apnea     Patient Active Problem List   Diagnosis Date Noted  . Indication for care in labor or delivery 04/26/2016  . Post-operative state 04/26/2016    Past Surgical History:  Procedure Laterality Date  . CESAREAN SECTION N/A 04/26/2016   Procedure: CESAREAN SECTION;  Surgeon: Suzy Bouchardhomas J Schermerhorn, MD;  Location: [facility] ORS;  Service: Obstetrics;  Laterality: N/A;  . TONSILLECTOMY      Prior to Admission medications   Medication Sig Start Date End Date Taking? Authorizing Provider  fexofenadine-pseudoephedrine (ALLEGRA-D) 60-120 MG 12 hr tablet Take 1 tablet by mouth 2 (two) times daily. 02/08/19   Tommi RumpsSummers, Rhonda L, PA-C    Allergies Patient has no known allergies.  History reviewed. No pertinent family history.  Social History Social History   Tobacco Use  . Smoking status: Former Smoker    Types: Cigarettes  . Smokeless tobacco: Former Engineer, waterUser  Substance Use Topics  . Alcohol use: No  . Drug use: No    Review of Systems Constitutional: Positive fever/chills Eyes: No visual changes. ENT: Positive sore  throat. Cardiovascular: Denies chest pain. Respiratory: Denies shortness of breath. Gastrointestinal: No abdominal pain.  No nausea, no vomiting.  Musculoskeletal: Positive for body aches. Skin: Negative for rash. Neurological: Positive for headaches, negative for focal weakness or numbness. ____________________________________________   PHYSICAL EXAM:  VITAL SIGNS: ED Triage Vitals  Enc Vitals Group     BP 02/08/19 1040 119/73     Pulse Rate 02/08/19 1040 (!) 106     Resp 02/08/19 1040 18     Temp 02/08/19 1040 (!) 100.7 F (38.2 C)     Temp Source 02/08/19 1040 Oral     SpO2 02/08/19 1040 96 %     Weight 02/08/19 1042 273 lb (123.8 kg)     Height 02/08/19 1042 5\' 4"  (1.626 m)     Head Circumference --      Peak Flow --      Pain Score 02/08/19 1041 9     Pain Loc --      Pain Edu? --      Excl. in GC? --    Constitutional: Alert and oriented. Well appearing and in no acute distress. Eyes: Conjunctivae are normal.  Head: Atraumatic. Nose: No congestion/rhinnorhea. Mouth/Throat: Mucous membranes are moist.  Oropharynx mildly erythematous but no exudate was seen.  There was positive posterior drainage present. Neck: No stridor.   Hematological/Lymphatic/Immunilogical: Moderately tender bilateral cervical lymphadenopathy. Cardiovascular: Normal rate, regular rhythm. Grossly normal heart sounds.  Good peripheral circulation. Respiratory: Normal respiratory effort.  No retractions. Lungs CTAB.  Musculoskeletal: Moves upper and lower extremities with any difficulty.  Normal gait was noted. Neurologic:  Normal speech and language. No gross focal neurologic deficits are appreciated. No gait instability. Skin:  Skin is warm, dry and intact. No rash noted. Psychiatric: Mood and affect are normal. Speech and behavior are normal.  ____________________________________________   LABS (all labs ordered are listed, but only abnormal results are displayed)  Labs Reviewed  GROUP A  STREP BY PCR    PROCEDURES  Procedure(s) performed (including Critical Care):  Procedures   ____________________________________________   INITIAL IMPRESSION / ASSESSMENT AND PLAN / ED COURSE  As part of my medical decision making, I reviewed the following data within the electronic MEDICAL RECORD NUMBER Notes from prior ED visits and [place] Controlled Substance Database  23 year old female presents to the ED with complaint of allergy-like symptoms that started 1 week ago.  Since that time she has developed fever and sore throat that began yesterday.  She has been taking some Benadryl for her allergies without relief.  She also reports headache, body aches with her sore throat and fever.  She denies any known COVID exposure.  Strep test was negative.  Physical exam was unremarkable.  Patient was given a prescription for Allegra-D 1 twice daily as needed for allergies.  She is to follow-up with her PCP if any continued problems. ____________________________________________   FINAL CLINICAL IMPRESSION(S) / ED DIAGNOSES  Final diagnoses:  Viral URI     ED Discharge Orders         Ordered    fexofenadine-pseudoephedrine (ALLEGRA-D) 60-120 MG 12 hr tablet  2 times daily     02/08/19 1229           Note:  This document was prepared using Dragon voice recognition software and may include unintentional dictation errors.    Johnn Hai, PA-C 02/08/19 1516    Delman Kitten, MD 02/08/19 [phone]

## 2019-02-08 NOTE — Discharge Instructions (Addendum)
Follow-up with your primary care provider if any continued problems.  You may take Tylenol or ibuprofen as needed for fever.  Begin taking Allegra-D as needed for congestion and allergy symptoms.  Increase fluids.

## 2019-02-10 ENCOUNTER — Telehealth: Payer: Self-pay

## 2019-02-10 DIAGNOSIS — Z20822 Contact with and (suspected) exposure to covid-19: Secondary | ICD-10-CM

## 2019-02-10 NOTE — Telephone Encounter (Signed)
Incoming call from, [name] of [place] requesting that Patient be tested for Covid-19 Call to Patient.  Patient scheduled for 02/12/19 @ 0815am .

## 2019-02-11 ENCOUNTER — Other Ambulatory Visit: Payer: Self-pay

## 2019-02-11 ENCOUNTER — Ambulatory Visit: Payer: Self-pay | Admitting: Surgery

## 2019-02-11 DIAGNOSIS — Z87891 Personal history of nicotine dependence: Secondary | ICD-10-CM | POA: Insufficient documentation

## 2019-02-11 DIAGNOSIS — R509 Fever, unspecified: Secondary | ICD-10-CM | POA: Diagnosis not present

## 2019-02-11 DIAGNOSIS — R070 Pain in throat: Secondary | ICD-10-CM | POA: Diagnosis present

## 2019-02-11 DIAGNOSIS — J029 Acute pharyngitis, unspecified: Secondary | ICD-10-CM | POA: Diagnosis not present

## 2019-02-11 DIAGNOSIS — Z8632 Personal history of gestational diabetes: Secondary | ICD-10-CM | POA: Insufficient documentation

## 2019-02-11 DIAGNOSIS — Z20828 Contact with and (suspected) exposure to other viral communicable diseases: Secondary | ICD-10-CM | POA: Diagnosis not present

## 2019-02-11 MED ORDER — ACETAMINOPHEN 500 MG PO TABS
ORAL_TABLET | ORAL | Status: AC
  Filled 2019-02-11: qty 2

## 2019-02-11 MED ORDER — ACETAMINOPHEN 500 MG PO TABS
1000.0000 mg | ORAL_TABLET | Freq: Once | ORAL | Status: AC
  Administered 2019-02-11: 1000 mg via ORAL

## 2019-02-11 NOTE — ED Triage Notes (Addendum)
Pt reports sore throat for 4 days.  Pt was seen in er with similar sx.  States negative strep test.  Pt reports intermittent fever   Pt alert

## 2019-02-12 ENCOUNTER — Other Ambulatory Visit: Payer: Medicaid Other

## 2019-02-12 ENCOUNTER — Emergency Department
Admission: EM | Admit: 2019-02-12 | Discharge: 2019-02-12 | Disposition: A | Discharge status: Home / Self Care | Payer: Medicaid Other | Attending: Emergency Medicine | Admitting: Emergency Medicine

## 2019-02-12 ENCOUNTER — Emergency Department: Payer: Medicaid Other

## 2019-02-12 DIAGNOSIS — J029 Acute pharyngitis, unspecified: Secondary | ICD-10-CM

## 2019-02-12 DIAGNOSIS — Z20822 Contact with and (suspected) exposure to covid-19: Secondary | ICD-10-CM

## 2019-02-12 DIAGNOSIS — R509 Fever, unspecified: Secondary | ICD-10-CM

## 2019-02-12 LAB — COMPREHENSIVE METABOLIC PANEL
ALT: 22 U/L (ref 0–44)
AST: 15 U/L (ref 15–41)
Albumin: 4 g/dL (ref 3.5–5.0)
Alkaline Phosphatase: 55 U/L (ref 38–126)
Anion gap: 13 (ref 5–15)
BUN: 15 mg/dL (ref 6–20)
CO2: 23 mmol/L (ref 22–32)
Calcium: 9.2 mg/dL (ref 8.9–10.3)
Chloride: 101 mmol/L (ref 98–111)
Creatinine, Ser: 1.04 mg/dL — ABNORMAL HIGH (ref 0.44–1.00)
GFR calc Af Amer: >60 mL/min (ref >60)
GFR calc non Af Amer: >60 mL/min (ref >60)
Glucose, Bld: 129 mg/dL — ABNORMAL HIGH (ref 70–99)
Potassium: 3.8 mmol/L (ref 3.5–5.1)
Sodium: 137 mmol/L (ref 135–145)
Total Bilirubin: 0.9 mg/dL (ref 0.3–1.2)
Total Protein: 7.9 g/dL (ref 6.5–8.1)

## 2019-02-12 LAB — CBC WITH DIFFERENTIAL/PLATELET
Abs Immature Granulocytes: 0.11 10*3/uL — ABNORMAL HIGH (ref 0.00–0.07)
Basophils Absolute: 0.1 10*3/uL (ref 0.0–0.1)
Basophils Relative: 0 %
Eosinophils Absolute: 0 10*3/uL (ref 0.0–0.5)
Eosinophils Relative: 0 %
HCT: 40.1 % (ref 36.0–46.0)
Hemoglobin: 13.3 g/dL (ref 12.0–15.0)
Immature Granulocytes: 1 %
Lymphocytes Relative: 15 %
Lymphs Abs: 2.7 10*3/uL (ref 0.7–4.0)
MCH: 28.2 pg (ref 26.0–34.0)
MCHC: 33.2 g/dL (ref 30.0–36.0)
MCV: 85.1 fL (ref 80.0–100.0)
Monocytes Absolute: 1.5 10*3/uL — ABNORMAL HIGH (ref 0.1–1.0)
Monocytes Relative: 8 %
Neutro Abs: 13.6 10*3/uL — ABNORMAL HIGH (ref 1.7–7.7)
Neutrophils Relative %: 76 %
Platelets: 413 10*3/uL — ABNORMAL HIGH (ref 150–400)
RBC: 4.71 MIL/uL (ref 3.87–5.11)
RDW: 12 % (ref 11.5–15.5)
WBC: 18 10*3/uL — ABNORMAL HIGH (ref 4.0–10.5)
nRBC: 0 % (ref 0.0–0.2)

## 2019-02-12 LAB — URINALYSIS, COMPLETE (UACMP) WITH MICROSCOPIC
Bacteria, UA: NONE SEEN
Bilirubin Urine: NEGATIVE
Glucose, UA: NEGATIVE mg/dL
Ketones, ur: 5 mg/dL — AB
Leukocytes,Ua: NEGATIVE
Nitrite: NEGATIVE
Protein, ur: 30 mg/dL — AB
Specific Gravity, Urine: 1.023 (ref 1.005–1.030)
pH: 7 (ref 5.0–8.0)

## 2019-02-12 LAB — SARS CORONAVIRUS 2 BY RT PCR (HOSPITAL ORDER, PERFORMED IN ~~LOC~~ HOSPITAL LAB): SARS Coronavirus 2: NEGATIVE

## 2019-02-12 LAB — LACTIC ACID, PLASMA: Lactic Acid, Venous: 0.9 mmol/L (ref 0.5–1.9)

## 2019-02-12 MED ORDER — SODIUM CHLORIDE 0.9 % IV BOLUS
1000.0000 mL | Freq: Once | INTRAVENOUS | Status: AC
  Administered 2019-02-12: 1000 mL via INTRAVENOUS

## 2019-02-12 MED ORDER — MAGIC MOUTHWASH
10.0000 mL | Freq: Once | ORAL | Status: AC
  Administered 2019-02-12: 10 mL via ORAL
  Filled 2019-02-12: qty 10

## 2019-02-12 MED ORDER — SODIUM CHLORIDE 0.9 % IV SOLN
3.0000 g | Freq: Once | INTRAVENOUS | Status: AC
  Administered 2019-02-12: 3 g via INTRAVENOUS
  Filled 2019-02-12: qty 8

## 2019-02-12 MED ORDER — DEXAMETHASONE SODIUM PHOSPHATE 10 MG/ML IJ SOLN
10.0000 mg | Freq: Once | INTRAMUSCULAR | Status: AC
  Administered 2019-02-12: 04:00:00 10 mg via INTRAVENOUS
  Filled 2019-02-12: qty 1

## 2019-02-12 MED ORDER — MAGIC MOUTHWASH: 5.0000 mL | Freq: Three times a day (TID) | ORAL | 0 refills | Status: DC | PRN

## 2019-02-12 MED ORDER — AMOXICILLIN 500 MG PO CAPS: 500.0000 mg | ORAL_CAPSULE | Freq: Three times a day (TID) | ORAL | 0 refills | Status: DC

## 2019-02-12 NOTE — ED Provider Notes (Signed)
[facility] Emergency Department Provider Note   ____________________________________________   First MD Initiated Contact with Patient 02/12/19 0345     (approximate)  I have reviewed the triage vital signs and the nursing notes.   HISTORY  Chief Complaint Sore Throat and Fever    HPI [name] is a 23 y.o. female who returns to the ED from home with persistent sore throat, now with fever.  Patient was seen in the ED on 7/5 with sore throat.  Diagnosed with allergies and viral pharyngitis; rapid strep was negative and patient was instructed to take daily allergy tablet.  She did not meet symptoms for COVID testing.  Patient called her PCP thereafter who arranged for her to have COVID testing this morning.  Patient denies cough, chest pain, shortness of breath, abdominal pain, nausea or vomiting.  Denies recent travel, trauma or exposure to persons diagnosed with coronavirus.      Past Medical History:  Diagnosis Date  . Anxiety   . Depression   . Gestational diabetes   . Gestational diabetes mellitus (GDM) affecting pregnancy, antepartum 2017  . Sleep apnea     Patient Active Problem List   Diagnosis Date Noted  . Indication for care in labor or delivery 04/26/2016  . Post-operative state 04/26/2016    Past Surgical History:  Procedure Laterality Date  . CESAREAN SECTION N/A 04/26/2016   Procedure: CESAREAN SECTION;  Surgeon: Suzy Bouchardhomas J Schermerhorn, MD;  Location: [facility] ORS;  Service: Obstetrics;  Laterality: N/A;  . TONSILLECTOMY      Prior to Admission medications   Medication Sig Start Date End Date Taking? Authorizing Provider  fexofenadine-pseudoephedrine (ALLEGRA-D) 60-120 MG 12 hr tablet Take 1 tablet by mouth 2 (two) times daily. 02/08/19   Tommi RumpsSummers, Rhonda L, PA-C    Allergies Patient has no known allergies.  No family history on file.  Social History Social History   Tobacco Use  . Smoking status: Former Smoker   Types: Cigarettes  . Smokeless tobacco: Former Engineer, waterUser  Substance Use Topics  . Alcohol use: No  . Drug use: No    Review of Systems  Constitutional: Positive for fever/chills Eyes: No visual changes. ENT: Positive for sore throat. Cardiovascular: Denies chest pain. Respiratory: Denies shortness of breath. Gastrointestinal: No abdominal pain.  No nausea, no vomiting.  No diarrhea.  No constipation. Genitourinary: Negative for dysuria. Musculoskeletal: Negative for back pain. Skin: Negative for rash. Neurological: Negative for headaches, focal weakness or numbness.   ____________________________________________   PHYSICAL EXAM:  VITAL SIGNS: ED Triage Vitals  Enc Vitals Group     BP 02/11/19 2312 102/66     Pulse Rate 02/11/19 2312 (!) 118     Resp 02/11/19 2312 (!) 22     Temp 02/11/19 2312 (!) 103.3 F (39.6 C)     Temp Source 02/11/19 2312 Oral     SpO2 02/11/19 2312 95 %     Weight --      Height --      Head Circumference --      Peak Flow --      Pain Score 02/11/19 2314 9     Pain Loc --      Pain Edu? --      Excl. in GC? --     Constitutional: Alert and oriented. Well appearing and in mild acute distress. Eyes: Conjunctivae are normal. PERRL. EOMI. Head: Atraumatic. Nose: No congestion/rhinnorhea. Mouth/Throat: Mucous membranes are moist.  Oropharynx moderately erythematous.  Patient is status post tonsillectomy.  No exudates.  There is no hoarse or muffled voice.  There is no drooling. Neck: No stridor.  Supple neck without meningismus. Hematological/Lymphatic/Immunilogical: No cervical lymphadenopathy. Cardiovascular: Normal rate, regular rhythm. Grossly normal heart sounds.  Good peripheral circulation. Respiratory: Normal respiratory effort.  No retractions. Lungs CTAB. Gastrointestinal: Soft and nontender. No distention. No abdominal bruits. No CVA tenderness. Musculoskeletal: No lower extremity tenderness nor edema.  No joint effusions. Neurologic:   Normal speech and language. No gross focal neurologic deficits are appreciated. No gait instability. Skin:  Skin is warm, dry and intact. No rash noted.  No petechiae. Psychiatric: Mood and affect are normal. Speech and behavior are normal.  ____________________________________________   LABS (all labs ordered are listed, but only abnormal results are displayed)  Labs Reviewed  COMPREHENSIVE METABOLIC PANEL - Abnormal; Notable for the following components:      Result Value   Glucose, Bld 129 (*)    Creatinine, Ser 1.04 (*)    All other components within normal limits  CBC WITH DIFFERENTIAL/PLATELET - Abnormal; Notable for the following components:   WBC 18.0 (*)    Platelets 413 (*)    Neutro Abs 13.6 (*)    Monocytes Absolute 1.5 (*)    Abs Immature Granulocytes 0.11 (*)    All other components within normal limits  URINALYSIS, COMPLETE (UACMP) WITH MICROSCOPIC - Abnormal; Notable for the following components:   Color, Urine YELLOW (*)    APPearance HAZY (*)    Hgb urine dipstick SMALL (*)    Ketones, ur 5 (*)    Protein, ur 30 (*)    All other components within normal limits  SARS CORONAVIRUS 2 (HOSPITAL ORDER, [facility])  CULTURE, BLOOD (ROUTINE X 2)  CULTURE, BLOOD (ROUTINE X 2)  LACTIC ACID, PLASMA  LACTIC ACID, PLASMA  POC URINE PREG, ED   ____________________________________________  EKG  None ____________________________________________  RADIOLOGY  ED MD interpretation:  No acute cardiopulmonary process  Official radiology report(s): Dg Chest Port 1 View  Result Date: 02/12/2019 CLINICAL DATA:  Sore throat for 4 days. EXAM: PORTABLE CHEST 1 VIEW COMPARISON:  None. FINDINGS: The heart size and mediastinal contours are within normal limits. Both lungs are clear. The visualized skeletal structures are unremarkable. IMPRESSION: Negative one-view chest x-ray Electronically Signed   By: San Morelle M.D.   On: 02/12/2019 04:06     ____________________________________________   PROCEDURES  Procedure(s) performed (including Critical Care):  Procedures   ____________________________________________   INITIAL IMPRESSION / ASSESSMENT AND PLAN / ED COURSE  As part of my medical decision making, I reviewed the following data within the [place] notes reviewed and incorporated, Labs reviewed, Old chart reviewed, Radiograph reviewed and Notes from prior ED visits     [name] was evaluated in Emergency Department on 02/12/2019 for the symptoms described in the history of present illness. She was evaluated in the context of the global COVID-19 pandemic, which necessitated consideration that the patient might be at risk for infection with the SARS-CoV-2 virus that causes COVID-19. Institutional protocols and algorithms that pertain to the evaluation of patients at risk for COVID-19 are in a state of rapid change based on information released by regulatory bodies including the CDC and federal and state organizations. These policies and algorithms were followed during the patient's care in the ED.   23 year old female who returns to the ED for persistent sore throat, now with 103  F fever.  Meets sepsis criteria based on vital signs.  Noted leukocytosis, normal lactic acid.  Will initiate IV fluid resuscitation, Decadron, Magic mouthwash.  Start IV Unasyn.  Obtain chest x-ray and COVID swab.   Clinical Course as of Feb 12 531  Thu Feb 12, 2019  0532 Patient feeling significantly better.  No longer febrile or tachycardic.  COVID is negative.  Will discharge home on amoxicillin and Magic mouthwash as needed.  Strict return precautions given.  Patient verbalizes understanding and agrees with plan of care.   [JS]    Clinical Course User Index [JS] Irean HongSung,  J, MD     ____________________________________________   FINAL CLINICAL IMPRESSION(S) / ED DIAGNOSES  Final diagnoses:  Fever,  unspecified fever cause  Sore throat  Pharyngitis, unspecified etiology     ED Discharge Orders    None       Note:  This document was prepared using Dragon voice recognition software and may include unintentional dictation errors.   Irean HongSung,  J, MD 02/12/19 [phone]

## 2019-02-12 NOTE — Discharge Instructions (Signed)
1.  Alternate Tylenol and Ibuprofen every 4 hours as needed for fever greater than 100.4 F. 2.  Take antibiotic as prescribed (Amoxicillin 500 mg 3 times daily x7 days). 3.  You may take Magic Mouthwash as needed for throat discomfort. 4.  Return to the ER for worsening symptoms, persistent vomiting, difficulty breathing or other concerns.

## 2019-02-12 NOTE — ED Notes (Addendum)
All blood work drawn and sent to lab at this time

## 2019-02-17 LAB — CULTURE, BLOOD (ROUTINE X 2)
Culture: NO GROWTH
Culture: NO GROWTH
Special Requests: ADEQUATE
Special Requests: ADEQUATE

## 2019-02-18 ENCOUNTER — Ambulatory Visit: Payer: Self-pay | Admitting: Surgery

## 2019-02-20 ENCOUNTER — Ambulatory Visit (INDEPENDENT_AMBULATORY_CARE_PROVIDER_SITE_OTHER): Payer: Medicaid Other | Admitting: Surgery

## 2019-02-20 ENCOUNTER — Encounter: Payer: Self-pay | Admitting: Surgery

## 2019-02-20 ENCOUNTER — Other Ambulatory Visit: Payer: Self-pay

## 2019-02-20 VITALS — BP 120/82 | HR 84 | Temp 97.7°F | Ht 64.0 in | Wt 257.0 lb

## 2019-02-20 DIAGNOSIS — K8020 Calculus of gallbladder without cholecystitis without obstruction: Secondary | ICD-10-CM

## 2019-02-20 NOTE — Progress Notes (Signed)
02/20/2019  Reason for Visit: Symptomatic cholelithiasis  History of Present Illness: [name] is a 23 y.o. female presenting for evaluation of symptomatic cholelithiasis.  Patient presented to the emergency room on 01/31/2019 with right upper quadrant abdominal pain as well as nausea and vomiting.  She reports that her pain was quite significant and it was the first time that she has had this kind of episode.  She reports that after being discharged from the emergency room, he did have very mild twinges of discomfort in the right upper quadrant but otherwise has been asymptomatic.  She did go to the emergency room twice with sore throat and fever on 7/5 and 7/9 was given antibiotics for pharyngitis and now feels well.  Currently denies any nausea, vomiting, abdominal pain, fevers, chills.  Past Medical History: Past Medical History:  Diagnosis Date  . Anxiety   . Depression   . Gestational diabetes   . Gestational diabetes mellitus (GDM) affecting pregnancy, antepartum 2017  . Sleep apnea      Past Surgical History: Past Surgical History:  Procedure Laterality Date  . CESAREAN SECTION N/A 04/26/2016   Procedure: CESAREAN SECTION;  Surgeon: Boykin Nearing, MD;  Location: [facility] ORS;  Service: Obstetrics;  Laterality: N/A;  . TONSILLECTOMY      Home Medications: Prior to Admission medications   Not on File    Allergies: No Known Allergies  Social History:  reports that she has quit smoking. Her smoking use included cigarettes. She has quit using smokeless tobacco. She reports that she does not drink alcohol or use drugs.   Family History: Her mother had a cholecystectomy around age 93  Review of Systems: Review of Systems  Constitutional: Negative for chills and fever.  HENT: Negative for hearing loss.   Eyes: Negative for blurred vision.  Respiratory: Negative for shortness of breath.   Cardiovascular: Negative for chest pain.  Gastrointestinal: Negative for  abdominal pain, nausea and vomiting.  Genitourinary: Negative for dysuria.  Musculoskeletal: Negative for myalgias.  Skin: Negative for rash.  Neurological: Negative for dizziness.  Psychiatric/Behavioral: Negative for depression.    Physical Exam BP 120/82   Pulse 84   Temp 97.7 F (36.5 C) (Skin)   Ht 5\' 4"  (1.626 m)   Wt 257 lb (116.6 kg)   SpO2 98%   BMI 44.11 kg/m  CONSTITUTIONAL: No acute distress HEENT:  Normocephalic, atraumatic, extraocular motion intact. NECK: Trachea is midline, and there is no jugular venous distension.  RESPIRATORY:  Lungs are clear, and breath sounds are equal bilaterally. Normal respiratory effort without pathologic use of accessory muscles. CARDIOVASCULAR: Heart is regular without murmurs, gallops, or rubs. GI: The abdomen is soft, obese, nondistended, currently nontender to palpation.  MUSCULOSKELETAL:  Normal muscle strength and tone in all four extremities.  No peripheral edema or cyanosis. SKIN: Skin turgor is normal. There are no pathologic skin lesions.  NEUROLOGIC:  Motor and sensation is grossly normal.  Cranial nerves are grossly intact. PSYCH:  Alert and oriented to person, place and time. Affect is normal.  Laboratory Analysis: Labs from 01/31/2019: Sodium 138, potassium 3.9, chloride 103, CO2 28, BUN 14, creatinine 0.82.  Total bilirubin 0.4, AST 19, ALT 22, alkaline phosphatase 59, lipase 32.  WBC 10.4, hemoglobin 14, hematocrit 42.3, platelet 314.  Imaging: Ultrasound 01/31/2019: IMPRESSION: 1. Gallstone fixed in the gallbladder neck. Gallbladder is full but there is no inflammatory change typical of acute cholecystitis. 2. Possible hepatic steatosis.   Assessment and Plan: This is  a 23 y.o. female with symptomatic cholelithiasis.  - The patient reports that she has been feeling well since her visit to the emergency room but after discussing her the possibilities of low-fat diet versus cholecystectomy, she reports that she  does not want to have this kind of pain episode again in the future would like to proceed with surgery instead the primary option.  Discussed with her the role for laparoscopic cholecystectomy including the risks of bleeding, infection, injury to surrounding structures, possibility for open procedure, and she is willing to proceed.  Discussed with her that she would also require testing for COVID-19 as part of her preop testing.  She is in agreement with this.  We will schedule her surgery for 03/05/2019.  Face-to-face time spent with the patient and care providers was 45 minutes, with more than 50% of the time spent counseling, educating, and coordinating care of the patient.     Howie IllJose Luis Yerick Eggebrecht, MD [facility]

## 2019-02-25 ENCOUNTER — Telehealth: Payer: Self-pay | Admitting: *Deleted

## 2019-02-25 NOTE — Telephone Encounter (Signed)
Patient's surgery to be scheduled for 03-05-19 at [facility] with Dr. Hampton Abbot.   The patient is aware to have COVID-19 testing done on 03-02-19 at the [facility] drive thru ([address] [place]) between 8:00 am and 10:30 am. She is aware to isolate after, have no visitors, wash hands frequently, and avoid touching face.   The patient is aware she will be contacted by the [facility] to complete a phone interview on 02-26-19 between 1 and 5 pm.  Patient aware to be NPO after midnight and have a driver.   She is aware to check in at the [place] entrance where she will be screened for the coronavirus and then sent to Same Day Surgery.   Patient aware that she may have one visitor due to COVID-19 restrictions.   The patient verbalizes understanding of the above.   The patient is aware to call the office should she have further questions.

## 2019-02-26 ENCOUNTER — Encounter
Admission: RE | Admit: 2019-02-26 | Discharge: 2019-02-26 | Disposition: A | Discharge status: Home / Self Care | Payer: Medicaid Other | Source: Ambulatory Visit | Attending: Surgery | Admitting: Surgery

## 2019-02-26 ENCOUNTER — Other Ambulatory Visit: Payer: Self-pay

## 2019-02-26 NOTE — Patient Instructions (Addendum)
Your procedure is scheduled on: Thursday 03/05/19 Report to [place] [place]. To find out your arrival time please call [phone] between 1PM - 3PM on Wednesday 03/04/19.  Remember: Instructions that are not followed completely may result in serious medical risk, up to and including death, or upon the discretion of your surgeon and anesthesiologist your surgery may need to be rescheduled.     _X__ 1. Do not eat food after midnight the night before your procedure.                 No gum chewing or hard candies. You may drink clear liquids up to 2 hours                 before you are scheduled to arrive for your surgery- DO not drink clear                 liquids within 2 hours of the start of your surgery.                 Clear Liquids include:  water, apple juice without pulp, clear carbohydrate                 drink such as Clearfast or Gatorade, Black Coffee or Tea (Do not add                 anything to coffee or tea). Diabetics water only  __X__2.  On the morning of surgery brush your teeth with toothpaste and water, you                 may rinse your mouth with mouthwash if you wish.  Do not swallow any              toothpaste of mouthwash.     _X__ 3.  No Alcohol for 24 hours before or after surgery.   _X__ 4.  Do Not Smoke or use e-cigarettes For 24 Hours Prior to Your Surgery.                 Do not use any chewable tobacco products for at least 6 hours prior to                 surgery.  ____  5.  Bring all medications with you on the day of surgery if instructed.   __X__  6.  Notify your doctor if there is any change in your medical condition      (cold, fever, infections).     Do not wear jewelry, make-up, hairpins, clips or nail polish. Do not wear lotions, powders, or perfumes.  Do not shave 48 hours prior to surgery. Men may shave face and neck. Do not bring valuables to the hospital.    [facility] is not responsible  for any belongings or valuables.  Contacts, dentures/partials or body piercings may not be worn into surgery. Bring a case for your contacts, glasses or hearing aids, a denture cup will be supplied. Leave your suitcase in the car. After surgery it may be brought to your room. For patients admitted to the hospital, discharge time is determined by your treatment team.   Patients discharged the day of surgery will not be allowed to drive home.   Please read over the following fact sheets that you were given:   MRSA Information  __X__ Take these medicines the morning of surgery with A SIP OF WATER:  1.   2.   3.   4.  5.  6.  ____ Fleet Enema (as directed)   __X__ Use CHG Soap/SAGE wipes as directed  ____ Use inhalers on the day of surgery  ____ Stop metformin/Janumet/Farxiga 2 days prior to surgery    ____ Take 1/2 of usual insulin dose the night before surgery. No insulin the morning          of surgery.   ____ Stop Blood Thinners Coumadin/Plavix/Xarelto/Pleta/Pradaxa/Eliquis/Effient/Aspirin  on   Or contact your Surgeon, Cardiologist or Medical Doctor regarding  ability to stop your blood thinners  __X__ Stop Anti-inflammatories 7 days before surgery such as Advil, Ibuprofen, Motrin,  BC or Goodies Powder, Naprosyn, Naproxen, Aleve, Aspirin    __X__ Stop all herbal supplements, fish oil or vitamin E until after surgery.    ____ Bring C-Pap to the hospital.      Telephone interview with patient. Instructions provided. Patient verbalized understanding. Written instructions and CHG to be given at [facility] testing./cn

## 2019-03-02 ENCOUNTER — Telehealth: Payer: Self-pay

## 2019-03-02 ENCOUNTER — Other Ambulatory Visit
Admission: RE | Admit: 2019-03-02 | Discharge: 2019-03-02 | Disposition: A | Discharge status: Home / Self Care | Payer: Medicaid Other | Source: Ambulatory Visit | Attending: Surgery | Admitting: Surgery

## 2019-03-02 ENCOUNTER — Other Ambulatory Visit: Payer: Self-pay

## 2019-03-02 DIAGNOSIS — Z20828 Contact with and (suspected) exposure to other viral communicable diseases: Secondary | ICD-10-CM | POA: Insufficient documentation

## 2019-03-02 LAB — SARS CORONAVIRUS 2 (TAT 6-24 HRS): SARS Coronavirus 2: NEGATIVE

## 2019-03-02 NOTE — Telephone Encounter (Signed)
Spoke with the patient and she is rescheduled for surgery with Dr Dahlia Byes on 03/16/19. She is aware to call the Friday before to get her arrival time. She will go for Covid-19 testing on 03/12/19 at the [place] drive up testing between 8-10:30 am.

## 2019-03-02 NOTE — Telephone Encounter (Signed)
Need to reschedule the patient's surgery with Dr Hampton Abbot for 03/05/19 due to a family emergency.  A message has been left with her mother due to her voicemail not being set up.

## 2019-03-12 ENCOUNTER — Other Ambulatory Visit: Admission: RE | Admit: 2019-03-12 | Payer: Medicaid Other | Source: Ambulatory Visit

## 2019-03-12 ENCOUNTER — Telehealth: Payer: Self-pay | Admitting: *Deleted
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
## 2019-03-16 ENCOUNTER — Encounter: Admission: RE | Payer: Self-pay | Source: Home / Self Care

## 2019-03-16 ENCOUNTER — Ambulatory Visit: Admission: RE | Admit: 2019-03-16 | Payer: Medicaid Other | Source: Home / Self Care | Admitting: Surgery

## 2019-03-16 SURGERY — LAPAROSCOPIC CHOLECYSTECTOMY
Anesthesia: General

## 2020-09-26 IMAGING — US ULTRASOUND ABDOMEN LIMITED
1 series · 14 of 25 positions shown · non-contrast
Comparison: 09/22/2018

CLINICAL DATA: Abdominal pain for 4 hours

EXAM:
ULTRASOUND ABDOMEN LIMITED RIGHT UPPER QUADRANT

[Series 1: ultrasound abdomen limited · 14 of 43 slices shown]
[im 1/43]
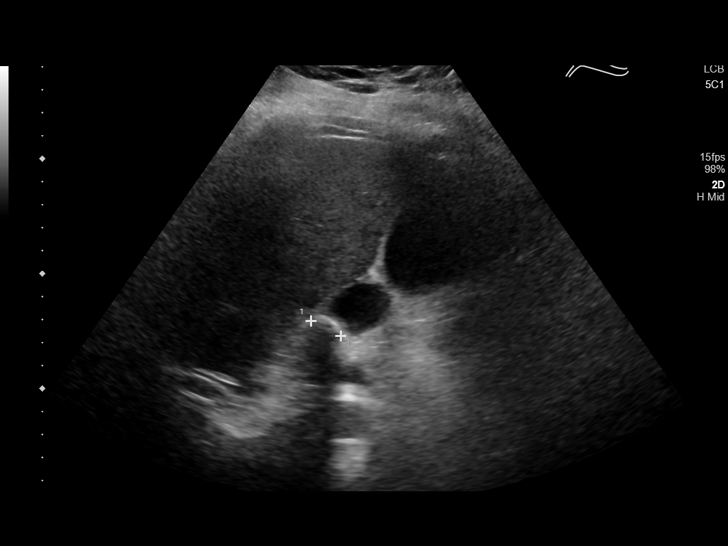
[im 4/43]
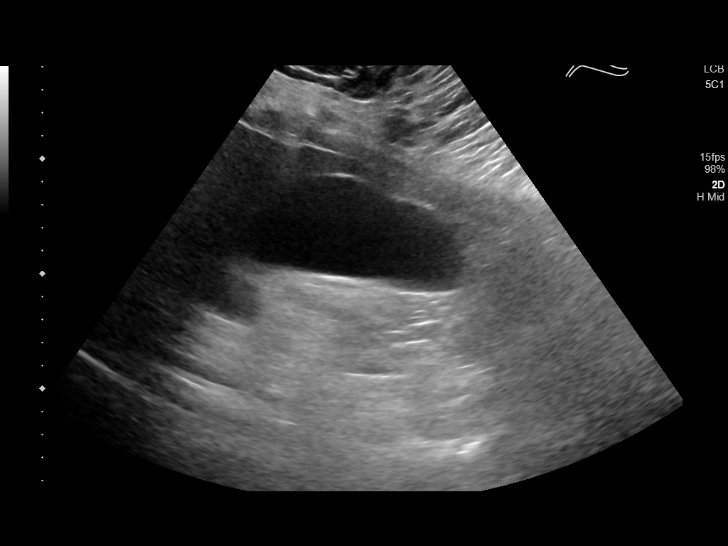
[im 8/43]
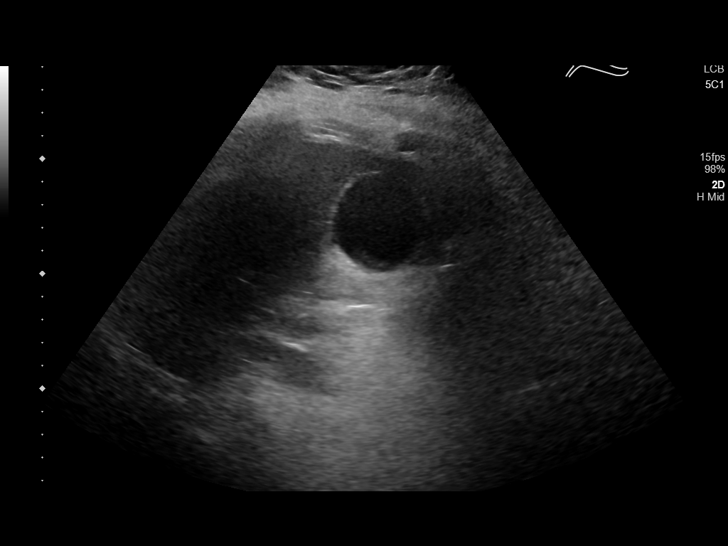
[im 11/43]
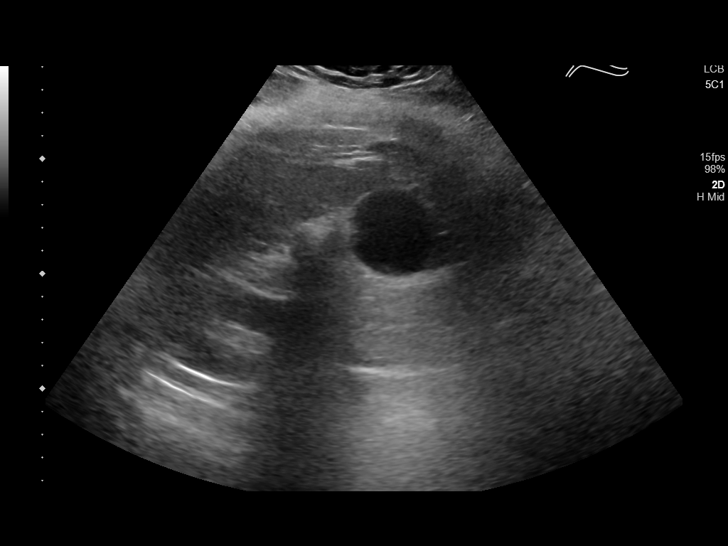
[im 15/43]
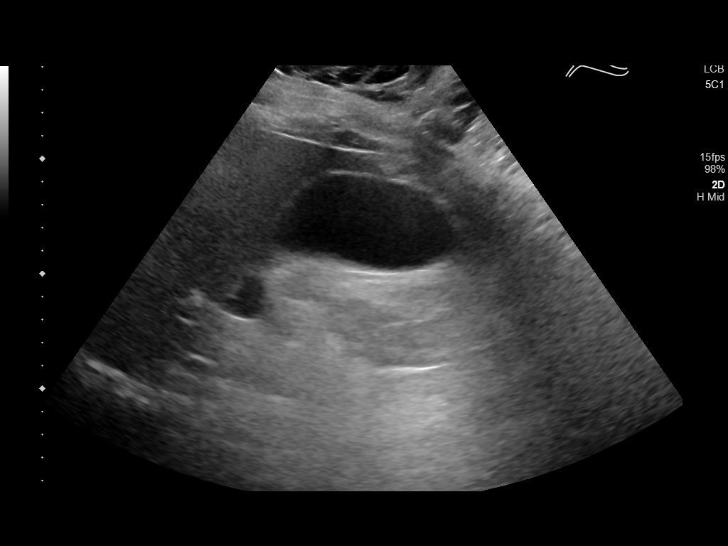
[im 16/43]
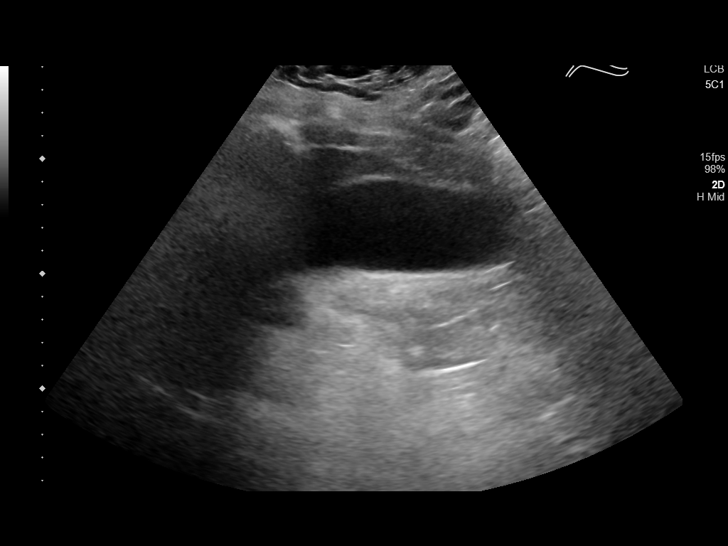
[im 20/43]
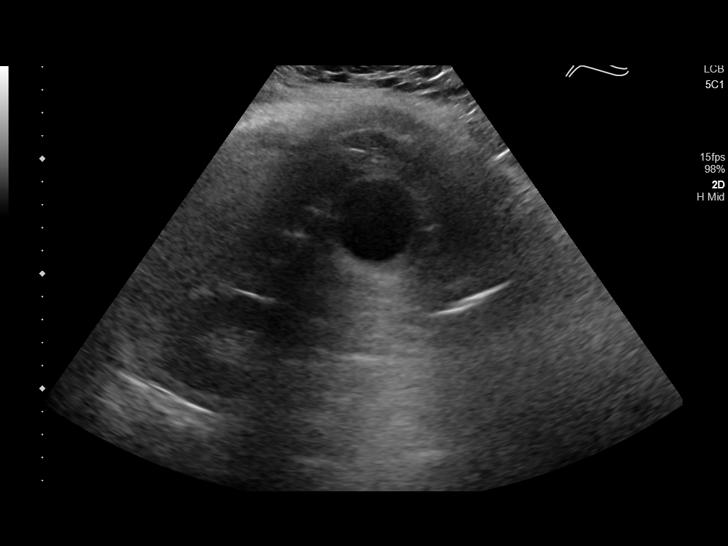
[im 23/43]
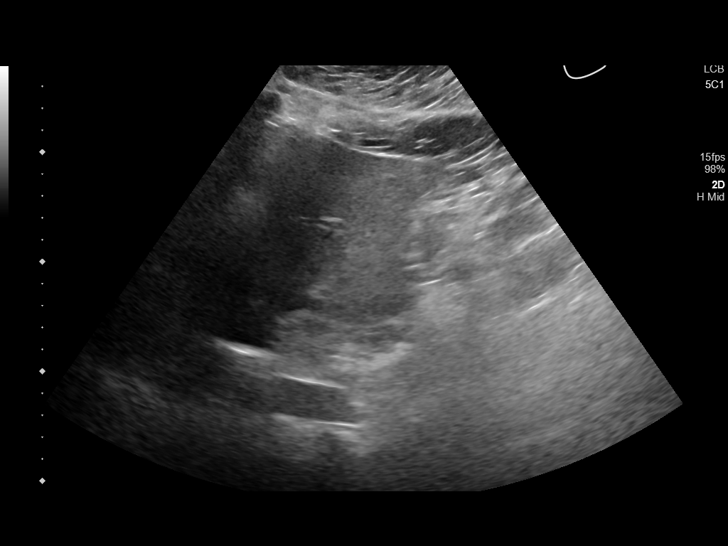
[im 27/43]
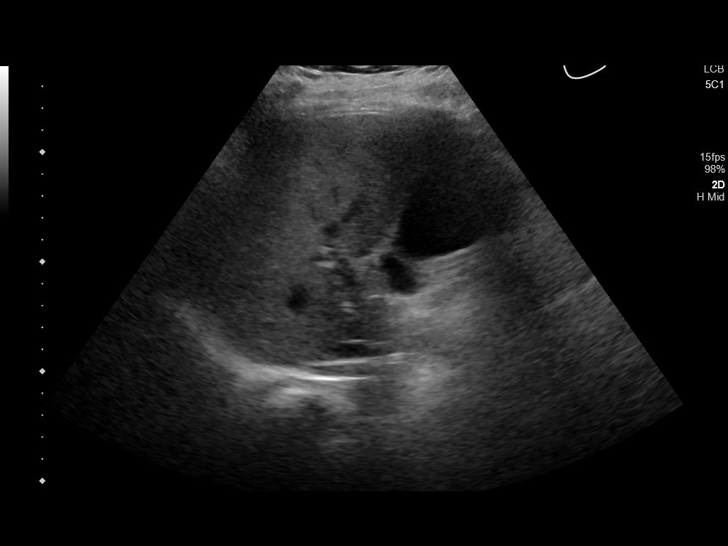
[im 29/43]
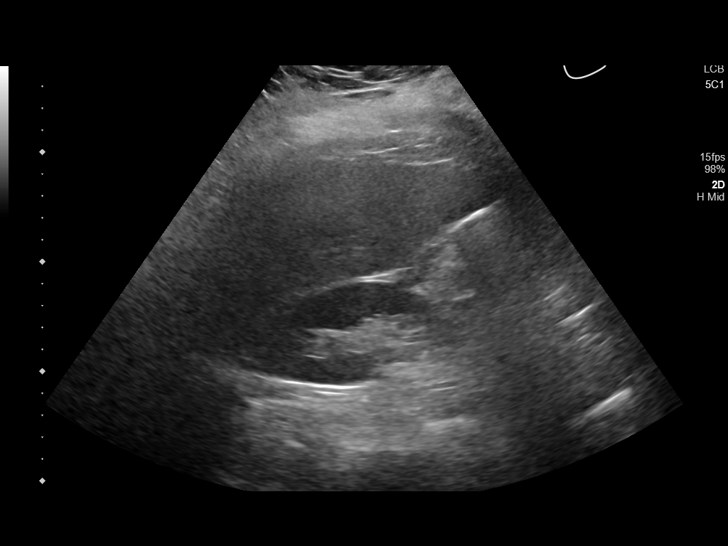
[im 32/43]
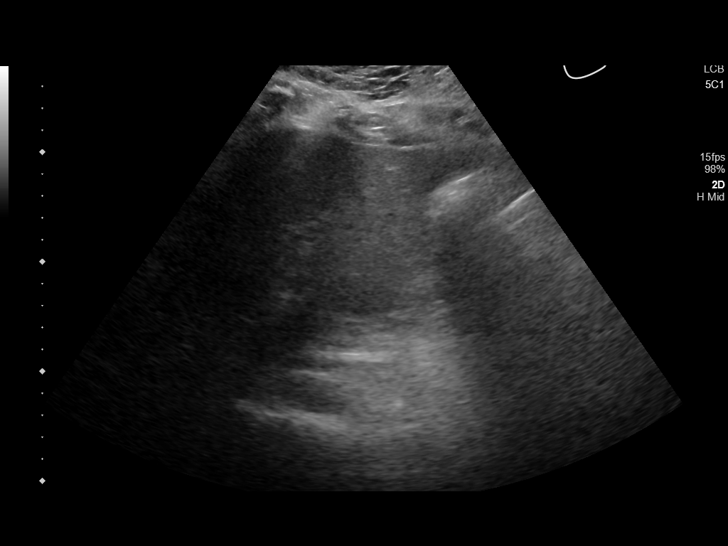
[im 36/43]
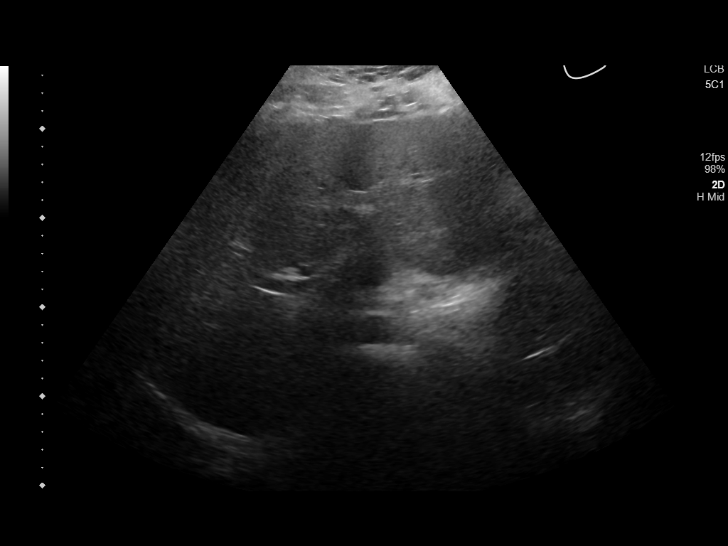
[im 39/43]
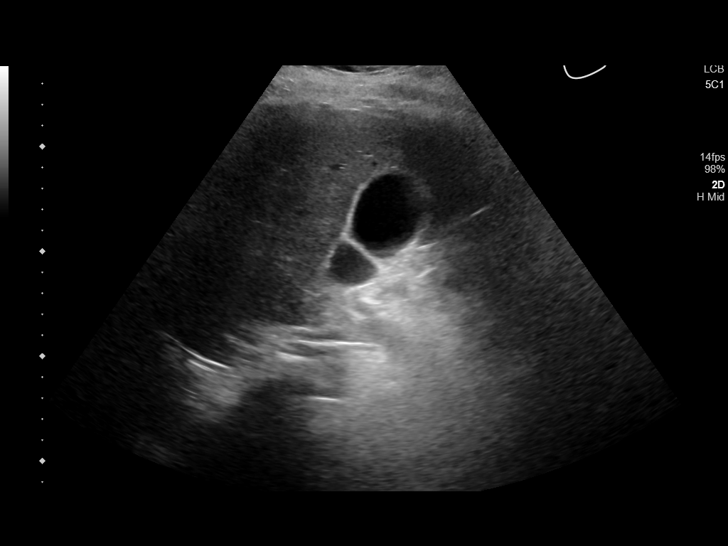
[im 43/43]
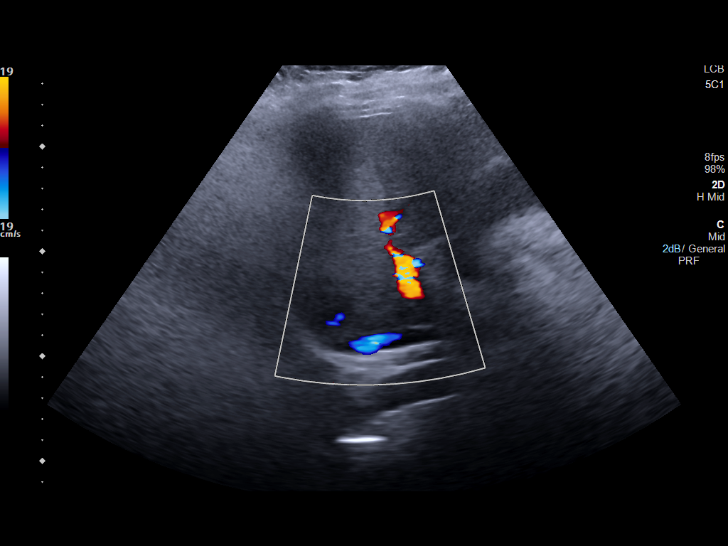

[14 of 25 positions shown; findings below may reference images not displayed]

FINDINGS: Gallbladder:

Gallstone fixed in the gallbladder neck and measuring 15 mm. The
gallbladder is full but there is no wall thickening or
pericholecystic edema. Negative sonographic Murphy sign.

Common bile duct:

Diameter: 5 mm.  Where visualized, no filling defect.

Liver:

No focal lesion identified. Borderline generalized increased
echogenicity. Portal vein is patent on color Doppler imaging with
normal direction of blood flow towards the liver.
IMPRESSION: 1. Gallstone fixed in the gallbladder neck. Gallbladder is full but
there is no inflammatory change typical of acute cholecystitis.
2. Possible hepatic steatosis.

## 2020-10-08 IMAGING — DX PORTABLE CHEST - 1 VIEW
1 series · 1 of 1 positions shown · non-contrast
Comparison: None.

CLINICAL DATA: Sore throat for 4 days.

EXAM:
PORTABLE CHEST 1 VIEW

[chest ap]
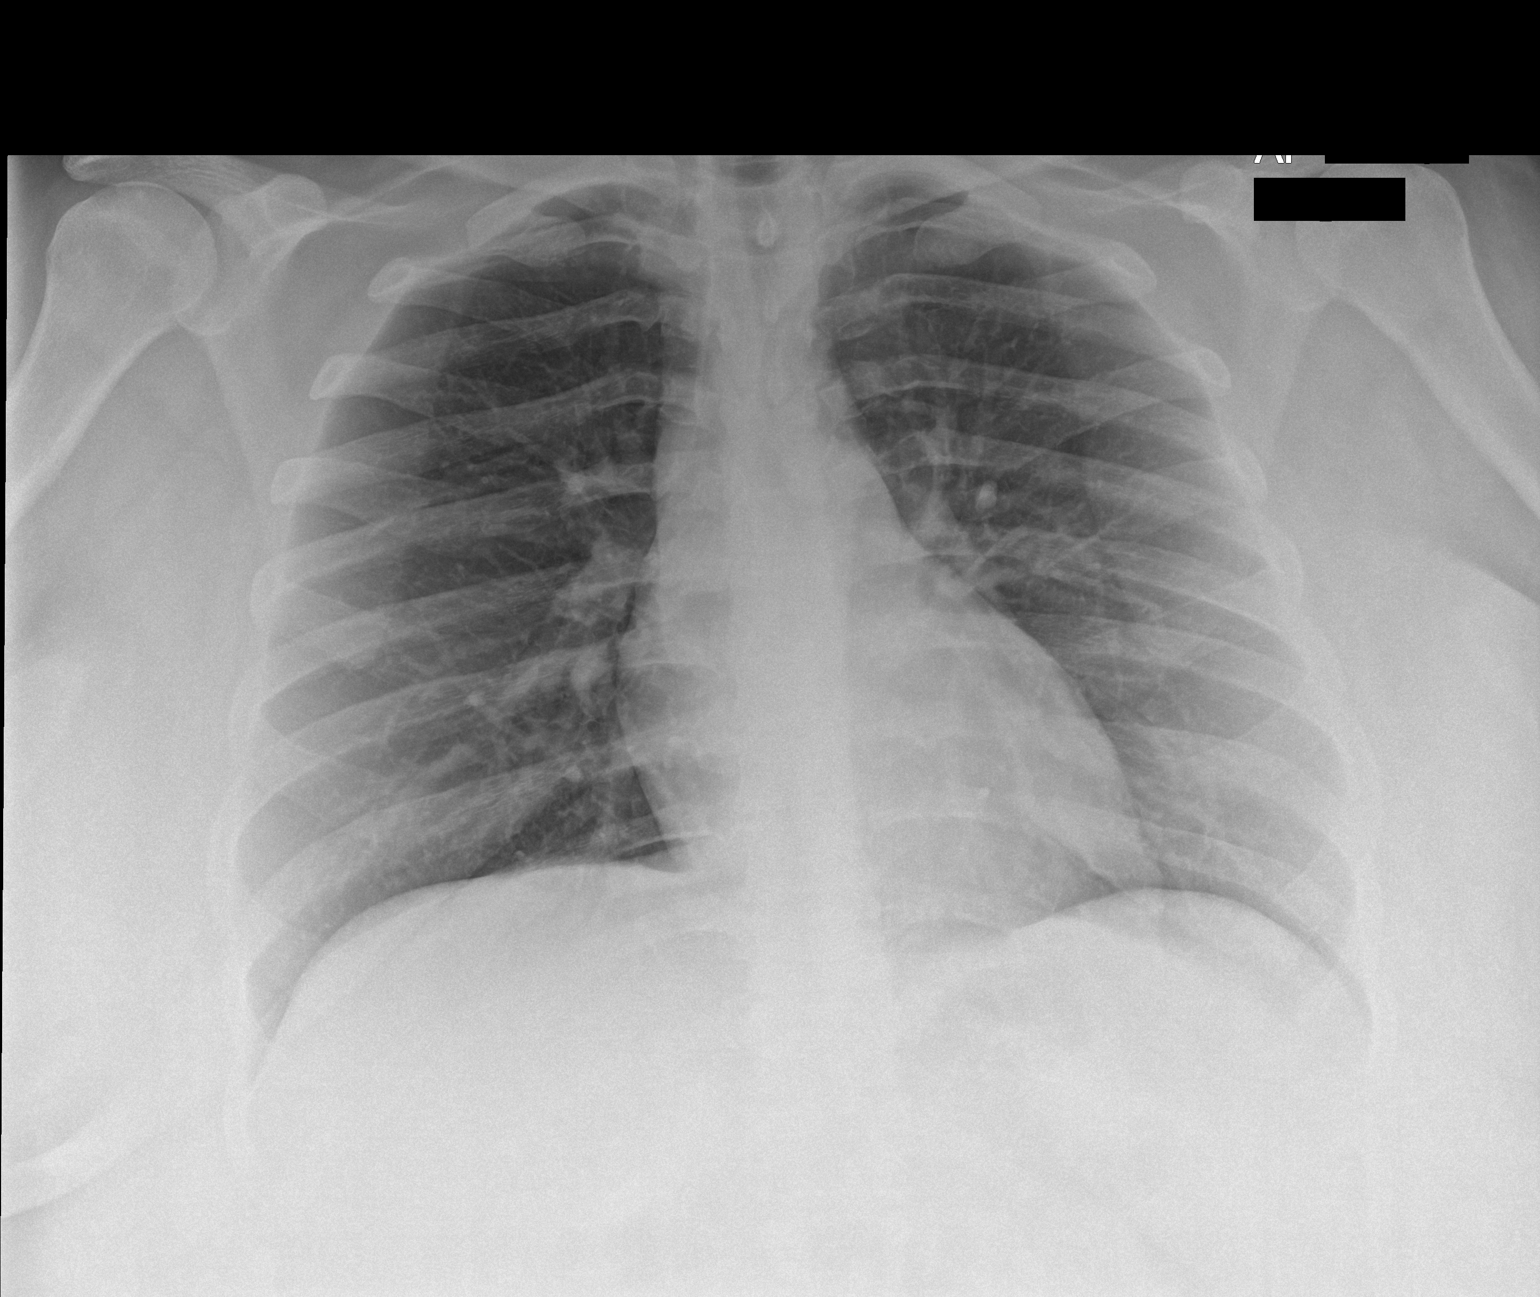

[1 of 1 positions shown; findings below may reference images not displayed]

FINDINGS: The heart size and mediastinal contours are within normal limits.
Both lungs are clear. The visualized skeletal structures are
unremarkable.
IMPRESSION: Negative one-view chest x-ray

## 2021-03-20 ENCOUNTER — Emergency Department: Payer: Medicaid Other

## 2021-03-20 ENCOUNTER — Emergency Department
Admission: EM | Admit: 2021-03-20 | Discharge: 2021-03-20 | Disposition: A | Discharge status: Home / Self Care | Payer: Medicaid Other | Attending: Emergency Medicine | Admitting: Emergency Medicine

## 2021-03-20 ENCOUNTER — Other Ambulatory Visit: Payer: Self-pay

## 2021-03-20 DIAGNOSIS — Z87891 Personal history of nicotine dependence: Secondary | ICD-10-CM | POA: Diagnosis not present

## 2021-03-20 DIAGNOSIS — K8020 Calculus of gallbladder without cholecystitis without obstruction: Secondary | ICD-10-CM | POA: Diagnosis not present

## 2021-03-20 DIAGNOSIS — R1011 Right upper quadrant pain: Secondary | ICD-10-CM | POA: Diagnosis present

## 2021-03-20 DIAGNOSIS — K8050 Calculus of bile duct without cholangitis or cholecystitis without obstruction: Secondary | ICD-10-CM

## 2021-03-20 LAB — HEPATIC FUNCTION PANEL
ALT: 27 U/L (ref 0–44)
AST: 22 U/L (ref 15–41)
Albumin: 3.3 g/dL — ABNORMAL LOW (ref 3.5–5.0)
Alkaline Phosphatase: 44 U/L (ref 38–126)
Bilirubin, Direct: 0.1 mg/dL (ref 0.0–0.2)
Indirect Bilirubin: 0.6 mg/dL (ref 0.3–0.9)
Total Bilirubin: 0.7 mg/dL (ref 0.3–1.2)
Total Protein: 6.2 g/dL — ABNORMAL LOW (ref 6.5–8.1)

## 2021-03-20 LAB — URINALYSIS, COMPLETE (UACMP) WITH MICROSCOPIC
Bacteria, UA: NONE SEEN
Bilirubin Urine: NEGATIVE
Glucose, UA: 50 mg/dL — AB
Hgb urine dipstick: NEGATIVE
Ketones, ur: NEGATIVE mg/dL
Leukocytes,Ua: NEGATIVE
Nitrite: NEGATIVE
Protein, ur: NEGATIVE mg/dL
Specific Gravity, Urine: 1.026 (ref 1.005–1.030)
pH: 5 (ref 5.0–8.0)

## 2021-03-20 LAB — BASIC METABOLIC PANEL
Anion gap: 7 (ref 5–15)
BUN: 14 mg/dL (ref 6–20)
CO2: 26 mmol/L (ref 22–32)
Calcium: 8.8 mg/dL — ABNORMAL LOW (ref 8.9–10.3)
Chloride: 102 mmol/L (ref 98–111)
Creatinine, Ser: 0.9 mg/dL (ref 0.44–1.00)
GFR, Estimated: >60 mL/min (ref >60)
Glucose, Bld: 180 mg/dL — ABNORMAL HIGH (ref 70–99)
Potassium: 4.4 mmol/L (ref 3.5–5.1)
Sodium: 135 mmol/L (ref 135–145)

## 2021-03-20 LAB — CBC
HCT: 41.3 % (ref 36.0–46.0)
Hemoglobin: 14 g/dL (ref 12.0–15.0)
MCH: 29.2 pg (ref 26.0–34.0)
MCHC: 33.9 g/dL (ref 30.0–36.0)
MCV: 86.2 fL (ref 80.0–100.0)
Platelets: 324 10*3/uL (ref 150–400)
RBC: 4.79 MIL/uL (ref 3.87–5.11)
RDW: 12.1 % (ref 11.5–15.5)
WBC: 18.2 10*3/uL — ABNORMAL HIGH (ref 4.0–10.5)
nRBC: 0 % (ref 0.0–0.2)

## 2021-03-20 LAB — POC URINE PREG, ED: Preg Test, Ur: NEGATIVE

## 2021-03-20 LAB — LIPASE, BLOOD: Lipase: 31 U/L (ref 11–51)

## 2021-03-20 NOTE — ED Triage Notes (Signed)
ED triage note written by Geraldine Contras, RN. Note entered under wrong Clinical research associate.

## 2021-03-20 NOTE — ED Provider Notes (Signed)
[facility] Emergency Department Provider Note  Time seen: 7:55 AM  I have reviewed the triage vital signs and the nursing notes.   HISTORY  Chief Complaint Flank Pain   HPI [name] is a 25 y.o. female with a past medical history of anxiety, presents to the emergency department for right upper quadrant abdominal pain.  According to the patient since 4 AM this morning she has been experiencing sharp pain in the right upper quadrant of her abdomen.  States nausea and vomited x1 prior to arrival and since the pain has reduced in severity considerably.  Denies any hematuria or dysuria.  No fever or diarrhea.  Patient states over the past 2 years she has been experiencing "gallbladder attacks."  Patient has been told she has a stone in her gallbladder and she needs to have her gallbladder out but the patient has not followed up with surgery regarding this.  Patient has not eaten since yesterday evening.   Past Medical History:  Diagnosis Date   Anxiety    Depression    Gestational diabetes    Gestational diabetes mellitus (GDM) affecting pregnancy, antepartum 2017   Sleep apnea     Patient Active Problem List   Diagnosis Date Noted   Indication for care in labor or delivery 04/26/2016   Post-operative state 04/26/2016    Past Surgical History:  Procedure Laterality Date   CESAREAN SECTION N/A 04/26/2016   Procedure: CESAREAN SECTION;  Surgeon: Suzy Bouchard, MD;  Location: [facility] ORS;  Service: Obstetrics;  Laterality: N/A;   TONSILLECTOMY      Prior to Admission medications   Not on File    No Known Allergies  No family history on file.  Social History Social History   Tobacco Use   Smoking status: Former    Types: Cigarettes   Smokeless tobacco: Former  Building services engineer Use: Never used  Substance Use Topics   Alcohol use: No   Drug use: No    Review of Systems Constitutional: Negative for fever. Cardiovascular:  Negative for chest pain. Respiratory: Negative for shortness of breath. Gastrointestinal: Right upper quadrant abdominal pain.  Positive for nausea vomiting x1.  Negative for diarrhea. Genitourinary: Negative for urinary compaints Musculoskeletal: Negative for musculoskeletal complaints Neurological: Negative for headache All other ROS negative  ____________________________________________   PHYSICAL EXAM:  VITAL SIGNS: ED Triage Vitals  Enc Vitals Group     BP 03/20/21 0732 (!) 155/76     Pulse Rate 03/20/21 0732 98     Resp 03/20/21 0732 18     Temp 03/20/21 0732 99.2 F (37.3 C)     Temp Source 03/20/21 0732 Oral     SpO2 03/20/21 0732 97 %     Weight 03/20/21 0733 279 lb (126.6 kg)     Height 03/20/21 0733 5\' 4"  (1.626 m)     Head Circumference --      Peak Flow --      Pain Score 03/20/21 0733 7     Pain Loc --      Pain Edu? --      Excl. in GC? --    Constitutional: Alert and oriented. Well appearing and in no distress. Eyes: Normal exam ENT      Head: Normocephalic and atraumatic.      Mouth/Throat: Mucous membranes are moist. Cardiovascular: Normal rate, regular rhythm.  Respiratory: Normal respiratory effort without tachypnea nor retractions. Breath sounds are clear  Gastrointestinal: Soft, moderate  right upper quadrant tenderness without rebound guarding or distention abdomen otherwise benign. Musculoskeletal: Nontender with normal range of motion in all extremities.  Neurologic:  Normal speech and language. No gross focal neurologic deficits  Skin:  Skin is warm, dry and intact.  Psychiatric: Mood and affect are normal. Speech and behavior are normal.   ____________________________________________   RADIOLOGY  Ultrasound shows gallbladder with 1 stone.  No pericholecystic fluid.  No gallbladder wall thickening.  ____________________________________________   INITIAL IMPRESSION / ASSESSMENT AND PLAN / ED COURSE  Pertinent labs & imaging results that  were available during my care of the patient were reviewed by me and considered in my medical decision making (see chart for details).   Patient presents emergency department for right upper quadrant abdominal pain since 4:00 this morning.  Patient states multiple episodes over the past 2 years of similar attacks only 2 of which have required her to go to the hospital.  We will check labs, right upper quadrant ultrasound.  We will continue to closely monitor while awaiting results.  Patient states after vomiting her pain has reduced significantly and is currently mild.  Ultrasound shows gallstones within the gallbladder with no other concerning findings.  Patient's pain is completely resolved.  Symptoms are very consistent with biliary colic.  I discussed with the patient the need to follow-up with general surgery for evaluation for consideration of cholecystectomy.  Patient's LFTs and lipase are normal white blood cell count is elevated however the patient states her pain is completely resolved.  Discussed my typical return precautions.  Antaniya [name] was evaluated in Emergency Department on 03/20/2021 for the symptoms described in the history of present illness. She was evaluated in the context of the global COVID-19 pandemic, which necessitated consideration that the patient might be at risk for infection with the SARS-CoV-2 virus that causes COVID-19. Institutional protocols and algorithms that pertain to the evaluation of patients at risk for COVID-19 are in a state of rapid change based on information released by regulatory bodies including the CDC and federal and state organizations. These policies and algorithms were followed during the patient's care in the ED.  ____________________________________________   FINAL CLINICAL IMPRESSION(S) / ED DIAGNOSES  Right upper quadrant abdominal pain   Minna Antis, MD 03/20/21 [phone]

## 2021-03-20 NOTE — ED Triage Notes (Signed)
Pt here with RUQ pain that started yesterday. radiates across her abd and down her lower back. Pt states that she is nauseous and has vomited once. Pt also states she has chills. Pt states that she has had gallbladder issues in the past but does not think this related. Pt also states that she has been taking a lot of Benadryl lately due to sinus issues.

## 2021-03-20 NOTE — Discharge Instructions (Addendum)
Please call the number provided for surgery to arrange a follow-up appointment for further evaluation of your gallbladder.  Return to the emergency department for any significant return of pain, development of fever, or any other symptom personally concerning to yourself.

## 2021-04-05 ENCOUNTER — Ambulatory Visit: Payer: Medicaid Other | Admitting: Surgery

## 2022-07-28 ENCOUNTER — Other Ambulatory Visit: Payer: Self-pay

## 2022-07-28 DIAGNOSIS — J101 Influenza due to other identified influenza virus with other respiratory manifestations: Secondary | ICD-10-CM | POA: Diagnosis not present

## 2022-07-28 DIAGNOSIS — R059 Cough, unspecified: Secondary | ICD-10-CM | POA: Diagnosis present

## 2022-07-28 DIAGNOSIS — Z20822 Contact with and (suspected) exposure to covid-19: Secondary | ICD-10-CM | POA: Diagnosis not present

## 2022-07-28 DIAGNOSIS — J020 Streptococcal pharyngitis: Secondary | ICD-10-CM | POA: Diagnosis not present

## 2022-07-28 LAB — RESP PANEL BY RT-PCR (RSV, FLU A&B, COVID)  RVPGX2
Influenza A by PCR: NEGATIVE
Influenza B by PCR: POSITIVE — AB
Resp Syncytial Virus by PCR: NEGATIVE
SARS Coronavirus 2 by RT PCR: NEGATIVE

## 2022-07-28 LAB — GROUP A STREP BY PCR: Group A Strep by PCR: DETECTED — AB

## 2022-07-28 MED ORDER — ACETAMINOPHEN 325 MG PO TABS
650.0000 mg | ORAL_TABLET | Freq: Once | ORAL | Status: AC | PRN
  Administered 2022-07-28: 650 mg via ORAL
  Filled 2022-07-28: qty 2

## 2022-07-28 NOTE — ED Triage Notes (Signed)
Pt reports cough, chills, body aches, and generalized fatigue that began on Monday.

## 2022-07-29 ENCOUNTER — Emergency Department
Admission: EM | Admit: 2022-07-29 | Discharge: 2022-07-29 | Disposition: A | Discharge status: Home / Self Care | Payer: Medicaid Other | Attending: Emergency Medicine | Admitting: Emergency Medicine

## 2022-07-29 DIAGNOSIS — J101 Influenza due to other identified influenza virus with other respiratory manifestations: Secondary | ICD-10-CM

## 2022-07-29 DIAGNOSIS — J020 Streptococcal pharyngitis: Secondary | ICD-10-CM

## 2022-07-29 MED ORDER — AMOXICILLIN 500 MG PO TABS: 500.0000 mg | ORAL_TABLET | Freq: Two times a day (BID) | ORAL | 0 refills | Status: AC

## 2022-07-29 NOTE — Discharge Instructions (Signed)
Please take Tylenol and ibuprofen/Advil for your pain.  It is safe to take them together, or to alternate them every few hours.  Take up to 1000mg of Tylenol at a time, up to 4 times per day.  Do not take more than 4000 mg of Tylenol in 24 hours.  For ibuprofen, take 400-600 mg, 3 - 4 times per day.  

## 2022-07-29 NOTE — ED Notes (Signed)
E-signature pad unavailable - Pt verbalized understanding of D/C information - no additional concerns at this time.  

## 2022-07-29 NOTE — ED Provider Notes (Signed)
[facility] Provider Note    Event Date/Time   First MD Initiated Contact with Patient 07/29/22 [phone]     (approximate)   History   Cough   HPI  [name] is a 26 y.o. female who presents to the ED for evaluation of Cough   Patient presents to the ED for evaluation of odynophagia, itchy throat, nonproductive cough, malaise and myalgias diffusely.  No syncope or chest pain.  Does report some throat pain with coughing.  No difficulty breathing.   Physical Exam   Triage Vital Signs: ED Triage Vitals  Enc Vitals Group     BP 07/28/22 2126 132/66     Pulse Rate 07/28/22 2126 (!) 103     Resp 07/28/22 2126 19     Temp 07/28/22 2126 (!) 102.4 F (39.1 C)     Temp Source 07/28/22 2126 Oral     SpO2 07/28/22 2126 96 %     Weight 07/28/22 2129 285 lb (129.3 kg)     Height 07/28/22 2129 5\' 4"  (1.626 m)     Head Circumference --      Peak Flow --      Pain Score 07/28/22 2129 9     Pain Loc --      Pain Edu? --      Excl. in GC? --     Most recent vital signs: Vitals:   07/28/22 2126 07/29/22 0400  BP: 132/66 116/78  Pulse: (!) 103 99  Resp: 19 18  Temp: (!) 102.4 F (39.1 C) 100.2 F (37.9 C)  SpO2: 96% 100%    General: Awake, no distress.  Morbidly obese, looks systemically well.  Widely patent airway s/p bilateral tonsillectomy without signs of PTA.  Uvula is midline. CV:  Good peripheral perfusion.  Resp:  Normal effort.  No tachypnea Abd:  No distention.  Soft and benign MSK:  No deformity noted.  Neuro:  No focal deficits appreciated. Other:     ED Results / Procedures / Treatments   Labs (all labs ordered are listed, but only abnormal results are displayed) Labs Reviewed  RESP PANEL BY RT-PCR (RSV, FLU A&B, COVID)  RVPGX2 - Abnormal; Notable for the following components:      Result Value   Influenza B by PCR POSITIVE (*)    All other components within normal limits  GROUP A STREP BY PCR - Abnormal; Notable for the  following components:   Group A Strep by PCR DETECTED (*)    All other components within normal limits    EKG   RADIOLOGY   Official radiology report(s): No results found.  PROCEDURES and INTERVENTIONS:  Procedures  Medications  acetaminophen (TYLENOL) tablet 650 mg (650 mg Oral Given 07/28/22 2135)     IMPRESSION / MDM / ASSESSMENT AND PLAN / ED COURSE  I reviewed the triage vital signs and the nursing notes.  Differential diagnosis includes, but is not limited to, sepsis, PTA, strep throat, viral syndrome  {Patient presents with symptoms of an acute illness or injury that is potentially life-threatening.  26 year old presents with evidence of flu a and strep throat requiring antibiotics to cover the strep component but suitable for outpatient management.  Look systemically well and her heart rate improved with antipyretics.  Considering her lack of focal symptoms and how she clinically looks, we decided to not pursue blood work because of her prolonged wait time for the time I see her.  We discussed amoxicillin to cover  for strep, antipyretics, staying hydrated and close return precautions for the ED.      FINAL CLINICAL IMPRESSION(S) / ED DIAGNOSES   Final diagnoses:  Strep throat  Influenza B     Rx / DC Orders   ED Discharge Orders          Ordered    amoxicillin (AMOXIL) 500 MG tablet  2 times daily        07/29/22 0408             Note:  This document was prepared using Dragon voice recognition software and may include unintentional dictation errors.   Vladimir Crofts, MD 07/29/22 0430

## 2022-11-14 IMAGING — US US ABDOMEN LIMITED
1 series · 14 of 25 positions shown · non-contrast
Comparison: January 31, 2019.

CLINICAL DATA: Acute right upper quadrant abdominal pain.

EXAM:
ULTRASOUND ABDOMEN LIMITED RIGHT UPPER QUADRANT

[Series 1: us abdomen limited ruq (liver/gb) · 14 of 70 slices shown]
[im 1/70]
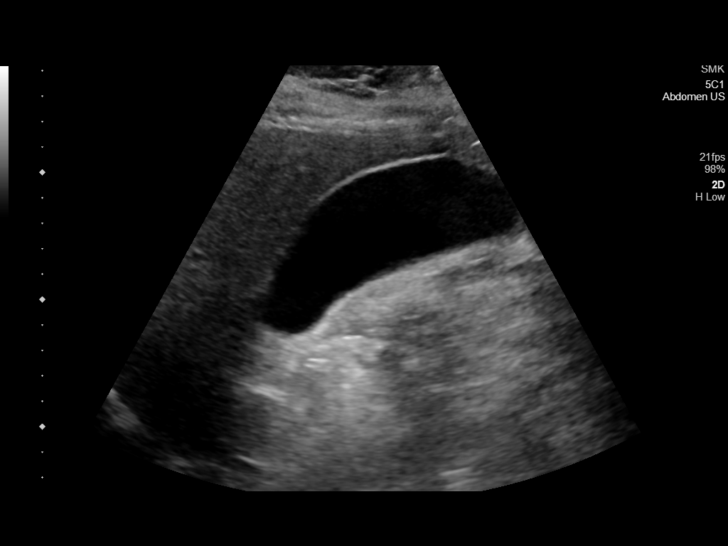
[im 6/70]
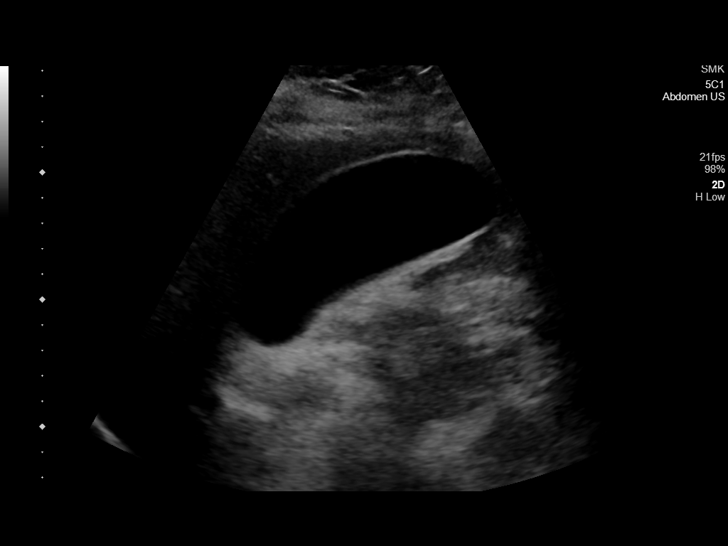
[im 12/70]
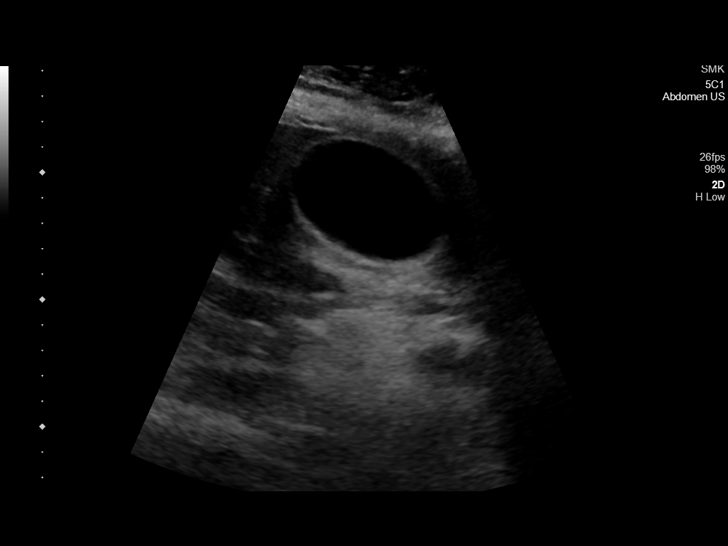
[im 18/70]
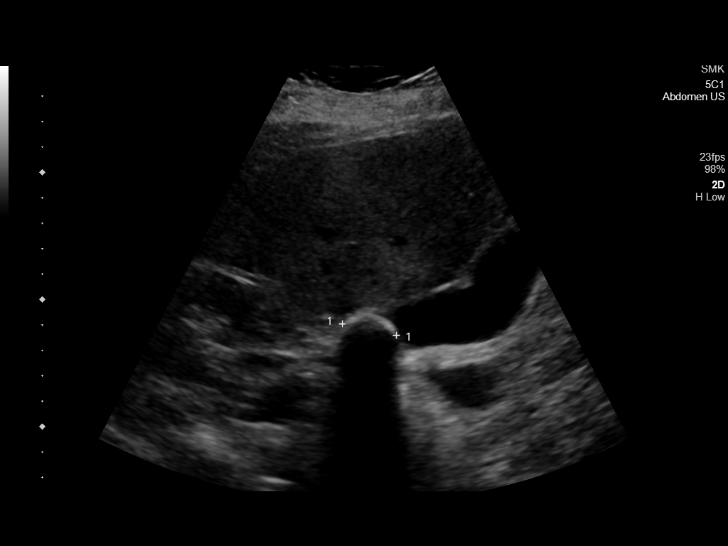
[im 24/70]
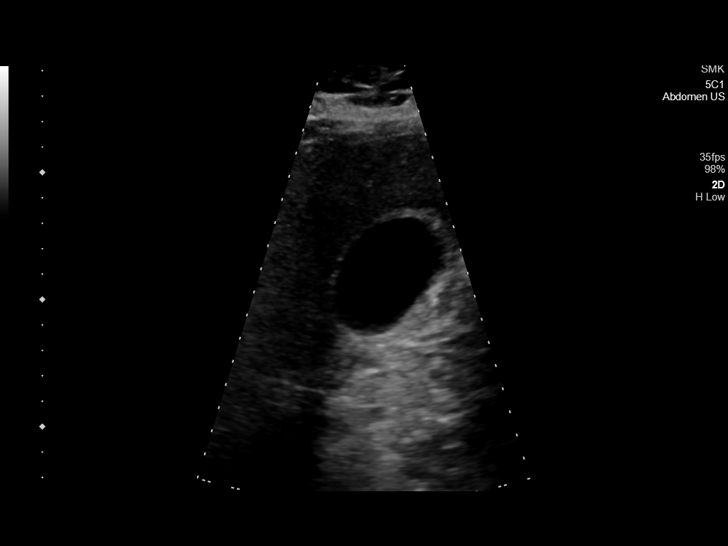
[im 26/70]
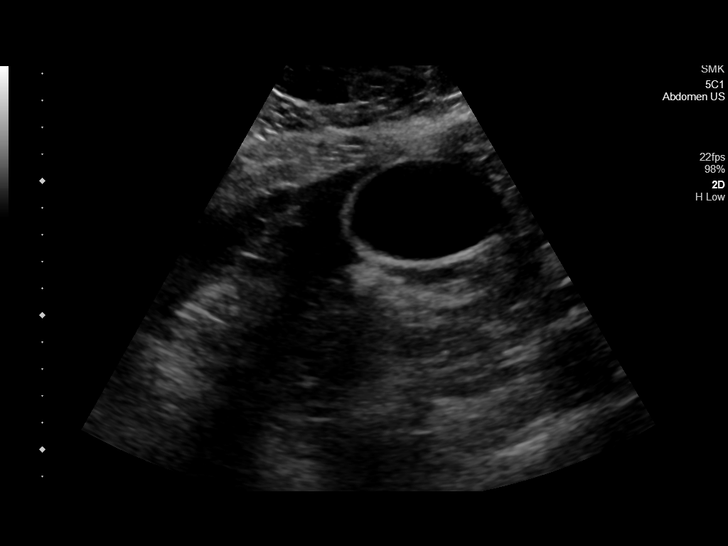
[im 32/70]
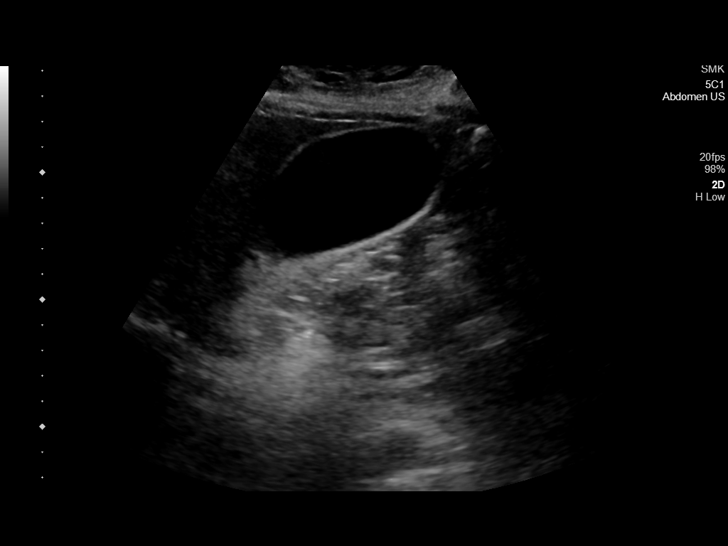
[im 38/70]
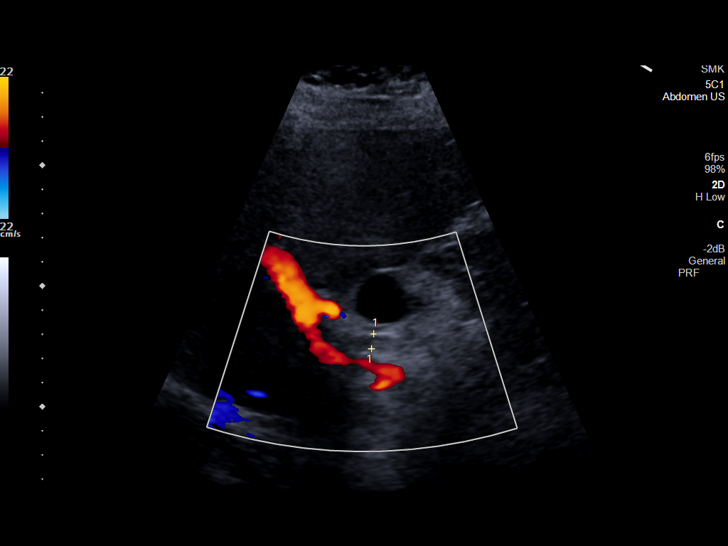
[im 44/70]
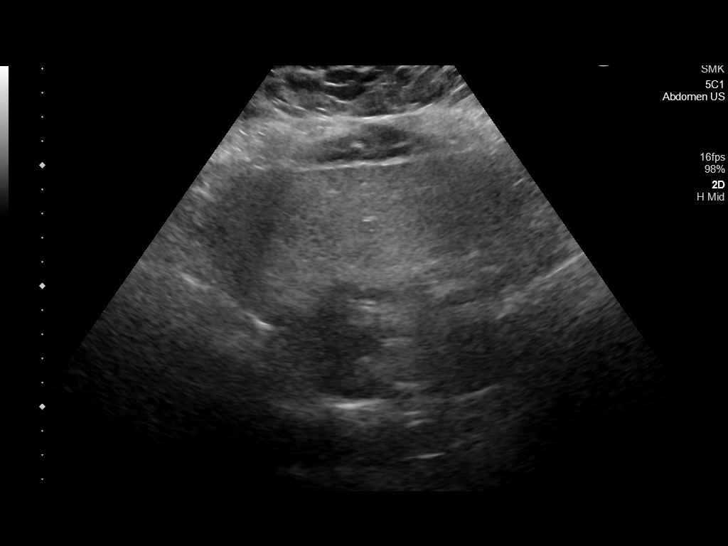
[im 47/70]
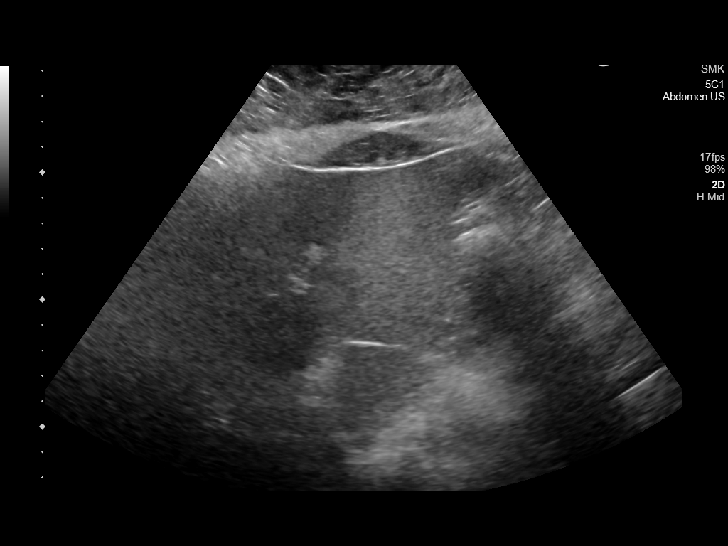
[im 52/70]
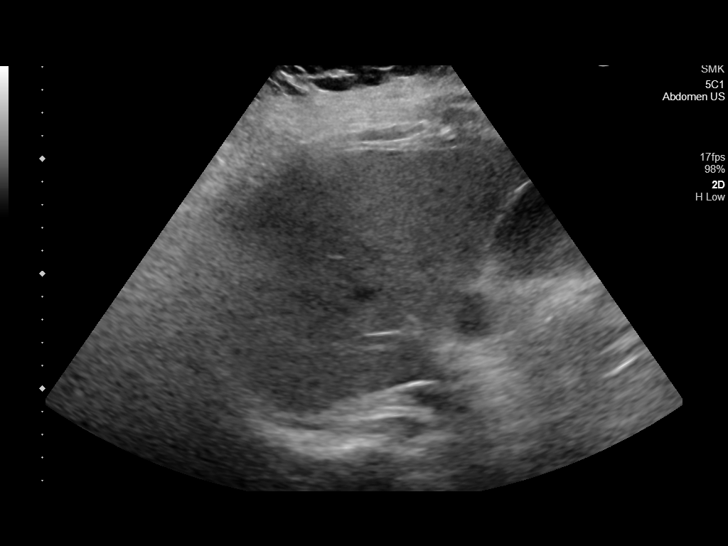
[im 58/70]
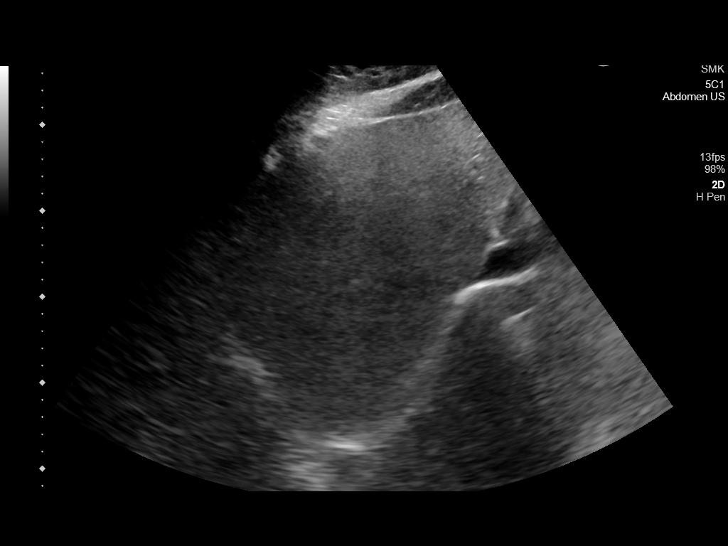
[im 64/70]
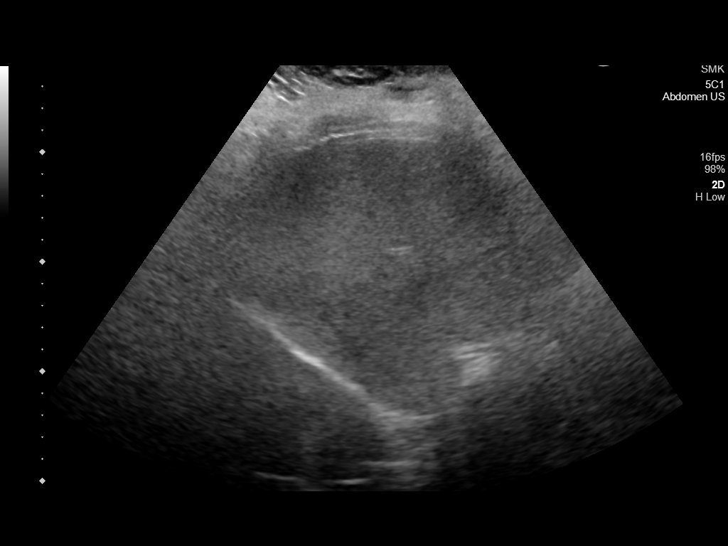
[im 70/70]
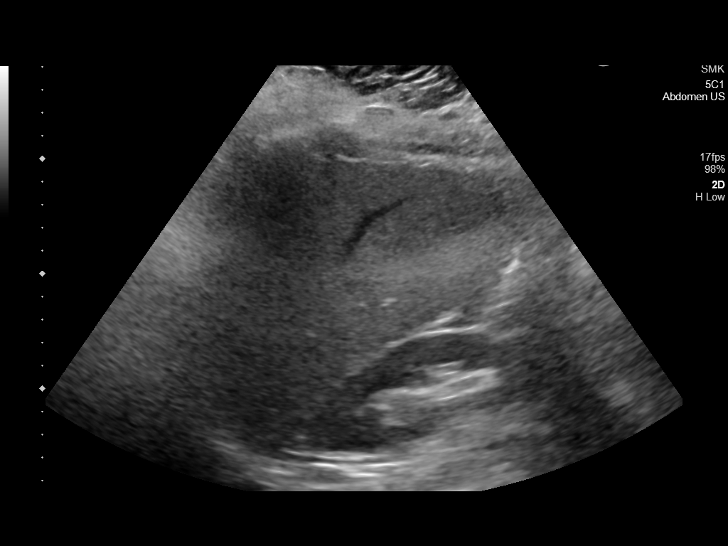

[14 of 25 positions shown; findings below may reference images not displayed]

FINDINGS: Gallbladder:

Continued presence 2.2 cm gallstone seen in gallbladder neck. No
gallbladder wall thickening or pericholecystic fluid is noted. No
sonographic Murphy's sign is noted.

Common bile duct:

Diameter: 6 mm which is within normal limits.

Liver:

No focal lesion identified. Increased echogenicity of hepatic
parenchyma is noted suggesting hepatic steatosis. Portal vein is
patent on color Doppler imaging with normal direction of blood flow
towards the liver.

Other: None.
IMPRESSION: Stable large solitary gallstone seen in gallbladder neck. Probable
hepatic steatosis.

## 2023-01-14 ENCOUNTER — Emergency Department
Admission: EM | Admit: 2023-01-14 | Discharge: 2023-01-14 | Disposition: A | Discharge status: Home / Self Care | Payer: Medicaid Other | Attending: Emergency Medicine | Admitting: Emergency Medicine

## 2023-01-14 ENCOUNTER — Other Ambulatory Visit: Payer: Self-pay

## 2023-01-14 ENCOUNTER — Emergency Department: Payer: Medicaid Other

## 2023-01-14 DIAGNOSIS — Y9234 Swimming pool (public) as the place of occurrence of the external cause: Secondary | ICD-10-CM | POA: Insufficient documentation

## 2023-01-14 DIAGNOSIS — Y9311 Activity, swimming: Secondary | ICD-10-CM | POA: Diagnosis not present

## 2023-01-14 DIAGNOSIS — W2209XA Striking against other stationary object, initial encounter: Secondary | ICD-10-CM | POA: Diagnosis not present

## 2023-01-14 DIAGNOSIS — S99922A Unspecified injury of left foot, initial encounter: Secondary | ICD-10-CM | POA: Diagnosis present

## 2023-01-14 NOTE — ED Triage Notes (Signed)
Pt to ed from home via POV for possible broken big toe. Pt was getting out of the swimming pool and she hit her toe on the ladder in the pool. Pt is caox4, in no acute distress and ambulatory in triage.

## 2023-01-14 NOTE — ED Provider Triage Note (Signed)
Emergency Medicine Provider Triage Evaluation Note  MALENI SEYER , a 27 y.o. female  was evaluated in triage.  Pt complains of left great toe pain after hitting it on the pool ladder yesterday. Pain increases with weightbearing.Marland Kitchen  Physical Exam  BP 121/67 (BP Location: Left Arm)   Pulse 84   Temp 99.1 F (37.3 C) (Oral)   Resp 18   Ht 5\' 4"  (1.626 m)   Wt 120.2 kg   SpO2 96%   BMI 45.49 kg/m  Gen:   Awake, no distress   Resp:  Normal effort  MSK:   Swelling of left great toe. Other:    Medical Decision Making  Medically screening exam initiated at 6:39 PM.  Appropriate orders placed.  Waldine MALISSIE MUSGRAVE was informed that the remainder of the evaluation will be completed by another provider, this initial triage assessment does not replace that evaluation, and the importance of remaining in the ED until their evaluation is complete.    Chinita Pester, FNP 01/14/23 [phone]

## 2023-01-14 NOTE — ED Provider Notes (Signed)
   [facility] Provider Note    Event Date/Time   First MD Initiated Contact with Patient 01/14/23 2040     (approximate)   History   Toe Injury (Left Great toe)   HPI  [name] is a 27 y.o. female   Past medical history of no significant past medical history presents to the emergency department with a toe injury.  Left great toe stubbed on pool side able to ambulate with pain.   There are no other injuries no other acute medical complaints.      Physical Exam   Triage Vital Signs: ED Triage Vitals  Enc Vitals Group     BP 01/14/23 1836 121/67     Pulse Rate 01/14/23 1836 84     Resp 01/14/23 1836 18     Temp 01/14/23 1836 99.1 F (37.3 C)     Temp Source 01/14/23 1836 Oral     SpO2 01/14/23 1836 96 %     Weight 01/14/23 1836 265 lb (120.2 kg)     Height 01/14/23 1836 5\' 4"  (1.626 m)     Head Circumference --      Peak Flow --      Pain Score 01/14/23 1840 10     Pain Loc --      Pain Edu? --      Excl. in GC? --     Most recent vital signs: Vitals:   01/14/23 1836  BP: 121/67  Pulse: 84  Resp: 18  Temp: 99.1 F (37.3 C)  SpO2: 96%    General: Awake, no distress.  CV:  Good peripheral perfusion.  Resp:  Normal effort.  Abd:  No distention.  Other:  No deformity to the affected great toe on the left side.  No significant tenderness to palpation, cap refill brisk, able to range.  No subungual hematoma noted.   ED Results / Procedures / Treatments   Labs (all labs ordered are listed, but only abnormal results are displayed) Labs Reviewed - No data to display    RADIOLOGY I independently reviewed and interpreted x-ray of the toe and see no obvious fracture or dislocation   PROCEDURES:  Critical Care performed: No  Procedures   MEDICATIONS ORDERED IN ED: Medications - No data to display  IMPRESSION / MDM / ASSESSMENT AND PLAN / ED COURSE  I reviewed the triage vital signs and the nursing notes.                                 Patient's presentation is most consistent with acute presentation with potential threat to life or bodily function.  Differential diagnosis includes, but is not limited to, fracture dislocation of the toe, subungual hematoma   MDM:   Stubbed toe looks well ambulating and negative x-ray no subungual hematoma, anticipatory guidance and discharge, buddy tape applied      FINAL CLINICAL IMPRESSION(S) / ED DIAGNOSES   Final diagnoses:  Injury of left great toe, initial encounter     Rx / DC Orders   ED Discharge Orders     None        Note:  This document was prepared using Dragon voice recognition software and may include unintentional dictation errors.    Pilar Jarvis, MD 01/14/23 [phone]

## 2023-01-14 NOTE — Discharge Instructions (Signed)
Take acetaminophen 650 mg and ibuprofen 400 mg every 6 hours for pain.  Take with food.  

## 2023-08-11 ENCOUNTER — Other Ambulatory Visit: Payer: Self-pay

## 2023-08-11 ENCOUNTER — Emergency Department
Admission: EM | Admit: 2023-08-11 | Discharge: 2023-08-11 | Disposition: A | Discharge status: Home / Self Care | Payer: Medicaid Other | Attending: Emergency Medicine | Admitting: Emergency Medicine

## 2023-08-11 DIAGNOSIS — L03032 Cellulitis of left toe: Secondary | ICD-10-CM | POA: Insufficient documentation

## 2023-08-11 DIAGNOSIS — L03039 Cellulitis of unspecified toe: Secondary | ICD-10-CM

## 2023-08-11 MED ORDER — CEPHALEXIN 500 MG PO CAPS: 500.0000 mg | ORAL_CAPSULE | Freq: Two times a day (BID) | ORAL | 0 refills | Status: AC

## 2023-08-11 NOTE — ED Provider Triage Note (Signed)
 Emergency Medicine Provider Triage Evaluation Note  [name] , a 28 y.o. female  was evaluated in triage.  Pt complains of left great toe infection. Patient states it started as an ingrown toe nail and now appears to be infected.  Review of Systems  Positive: Left great toe infection Negative: Pain in the toe  Physical Exam  BP 131/87   Pulse 89   Temp 98.1 F (36.7 C) (Oral)   Resp 16   SpO2 96%  Gen:   Awake, no distress   Resp:  Normal effort  MSK:   Moves extremities without difficulty  Other:  Green discoloration at the base of the great toe nail, no TTP, tip of toe is erythematous, capillary refill appropriate  Medical Decision Making  Medically screening exam initiated at 1:35 PM.  Appropriate orders placed.  [name] was informed that the remainder of the evaluation will be completed by another provider, this initial triage assessment does not replace that evaluation, and the importance of remaining in the ED until their evaluation is complete.    Cleaster Tinnie LABOR, PA-C 08/11/23 1338

## 2023-08-11 NOTE — ED Provider Notes (Signed)
 [facility] Provider Note    Event Date/Time   First MD Initiated Contact with Patient 08/11/23 1522     (approximate)   History   Paronychia   HPI  [name] is a 28 y.o. female who presents today with history of 2 days of greenish color hair left great toenail.  Patient reported losing the toenail a few months ago, 2 days ago patient tried to remove the nail.  Patient denies pain, fever      Physical Exam   Triage Vital Signs: ED Triage Vitals [08/11/23 1326]  Encounter Vitals Group     BP 131/87     Systolic BP Percentile      Diastolic BP Percentile      Pulse Rate 89     Resp 16     Temp 98.1 F (36.7 C)     Temp Source Oral     SpO2 96 %     Weight      Height      Head Circumference      Peak Flow      Pain Score 1     Pain Loc      Pain Education      Exclude from Growth Chart     Most recent vital signs: Vitals:   08/11/23 1326  BP: 131/87  Pulse: 89  Resp: 16  Temp: 98.1 F (36.7 C)  SpO2: 96%     Constitutional: Alert, nondistressed Eyes: Conjunctivae are normal.  Head: Atraumatic. Nose: No congestion/rhinnorhea. Mouth/Throat: Mucous membranes are moist.   Neck: Painless ROM.  Cardiovascular:   Good peripheral circulation. Respiratory: Normal respiratory effort.  No retractions.  Gastrointestinal: Soft and nontender.  Musculoskeletal:  no deformity Left foot: Great toenail: Presence of pus under the nail.  Skin is intact, no erythema, no warmth, no hard Neurologic:  MAE spontaneously. No gross focal neurologic deficits are appreciated.  Skin:  Skin is warm, dry and intact. No rash noted. Psychiatric: Mood and affect are normal. Speech and behavior are normal.    ED Results / Procedures / Treatments   Labs (all labs ordered are listed, but only abnormal results are displayed) Labs Reviewed - No data to display   EKG     RADIOLOGY    PROCEDURES:  Critical Care performed:   .Incision  and Drainage  Date/Time: 08/11/2023 3:56 PM  Performed by: Janit Kast, PA-C Authorized by: Janit Kast, PA-C   Consent:    Consent obtained:  Verbal   Risks, benefits, and alternatives were discussed: yes     Risks discussed:  Infection and pain Universal protocol:    Procedure explained and questions answered to patient or proxy's satisfaction: yes     Patient identity confirmed:  Verbally with patient Location:    Type:  Abscess   Location:  Lower extremity   Lower extremity location:  Toe   Toe location:  L big toe Pre-procedure details:    Skin preparation:  Antiseptic wash Sedation:    Sedation type:  None Anesthesia:    Anesthesia method:  None Procedure type:    Complexity:  Simple Procedure details:    Incision types:  Stab incision   Wound management:  Irrigated with saline   Drainage:  Purulent   Drainage amount:  Moderate   Wound treatment:  Wound left open   Packing materials:  None Post-procedure details:    Procedure completion:  Tolerated well, no immediate complications    MEDICATIONS  ORDERED IN ED: Medications - No data to display   IMPRESSION / MDM / ASSESSMENT AND PLAN / ED COURSE  I reviewed the triage vital signs and the nursing notes.  Differential diagnosis includes, but is not limited to, abscess, paronychia, cellulitis  Patient's presentation is most consistent with acute, uncomplicated illness.   Patient's diagnosis is consistent with paronychia. I independently reviewed and interpreted imaging and agree with radiologists findings. Labs are rea rea. I did review the patient's allergies and medications. Patient will be discharged home with prescriptions for Keflex . Patient is to follow up with CP as needed or otherwise directed. Patient is given ED precautions to return to the ED for any worsening or new symptoms. Discussed plan of care with patient, answered all of patient's questions, Patient agreeable to plan of care. Advised  patient to take medications according to the instructions on the label. Discussed possible side effects of new medications. Patient verbalized understanding.     FINAL CLINICAL IMPRESSION(S) / ED DIAGNOSES   Final diagnoses:  Paronychia of great toe     Rx / DC Orders   ED Discharge Orders          Ordered    cephALEXin  (KEFLEX ) 500 MG capsule  2 times daily        08/11/23 1601             Note:  This document was prepared using Dragon voice recognition software and may include unintentional dictation errors.   Janit Kast, PA-C 08/11/23 1602    Viviann Pastor, MD 08/11/23 2312

## 2023-08-11 NOTE — ED Triage Notes (Signed)
 Pt has nail infection to L great toenail. [name] discoloration to base of nail at cuticle.

## 2023-08-29 ENCOUNTER — Other Ambulatory Visit: Payer: Self-pay | Admitting: Family Medicine

## 2023-08-29 DIAGNOSIS — Z3480 Encounter for supervision of other normal pregnancy, unspecified trimester: Secondary | ICD-10-CM

## 2023-08-30 ENCOUNTER — Emergency Department
Admission: EM | Admit: 2023-08-30 | Discharge: 2023-08-31 | Discharge status: Against Medical Advice | Payer: Medicaid Other | Attending: Emergency Medicine | Admitting: Emergency Medicine

## 2023-08-30 ENCOUNTER — Other Ambulatory Visit: Payer: Self-pay

## 2023-08-30 DIAGNOSIS — Z5321 Procedure and treatment not carried out due to patient leaving prior to being seen by health care provider: Secondary | ICD-10-CM | POA: Insufficient documentation

## 2023-08-30 DIAGNOSIS — Z20822 Contact with and (suspected) exposure to covid-19: Secondary | ICD-10-CM | POA: Diagnosis not present

## 2023-08-30 DIAGNOSIS — Z3A00 Weeks of gestation of pregnancy not specified: Secondary | ICD-10-CM | POA: Diagnosis not present

## 2023-08-30 DIAGNOSIS — O219 Vomiting of pregnancy, unspecified: Secondary | ICD-10-CM | POA: Diagnosis present

## 2023-08-30 LAB — COMPREHENSIVE METABOLIC PANEL
ALT: 14 U/L (ref 0–44)
AST: 16 U/L (ref 15–41)
Albumin: 2.7 g/dL — ABNORMAL LOW (ref 3.5–5.0)
Alkaline Phosphatase: 92 U/L (ref 38–126)
Anion gap: 11 (ref 5–15)
BUN: 15 mg/dL (ref 6–20)
CO2: 20 mmol/L — ABNORMAL LOW (ref 22–32)
Calcium: 9.1 mg/dL (ref 8.9–10.3)
Chloride: 102 mmol/L (ref 98–111)
Creatinine, Ser: 0.6 mg/dL (ref 0.44–1.00)
GFR, Estimated: >60 mL/min (ref >60)
Glucose, Bld: 239 mg/dL — ABNORMAL HIGH (ref 70–99)
Potassium: 3.9 mmol/L (ref 3.5–5.1)
Sodium: 133 mmol/L — ABNORMAL LOW (ref 135–145)
Total Bilirubin: 0.5 mg/dL (ref 0.0–1.2)
Total Protein: 6.1 g/dL — ABNORMAL LOW (ref 6.5–8.1)

## 2023-08-30 LAB — URINALYSIS, ROUTINE W REFLEX MICROSCOPIC
Bacteria, UA: NONE SEEN
Bilirubin Urine: NEGATIVE
Glucose, UA: >=500 mg/dL — AB
Hgb urine dipstick: NEGATIVE
Ketones, ur: NEGATIVE mg/dL
Leukocytes,Ua: NEGATIVE
Nitrite: NEGATIVE
Protein, ur: 30 mg/dL — AB
Specific Gravity, Urine: 1.035 — ABNORMAL HIGH (ref 1.005–1.030)
pH: 6 (ref 5.0–8.0)

## 2023-08-30 LAB — CBC
HCT: 37.3 % (ref 36.0–46.0)
Hemoglobin: 12.6 g/dL (ref 12.0–15.0)
MCH: 28.8 pg (ref 26.0–34.0)
MCHC: 33.8 g/dL (ref 30.0–36.0)
MCV: 85.2 fL (ref 80.0–100.0)
Platelets: 324 10*3/uL (ref 150–400)
RBC: 4.38 MIL/uL (ref 3.87–5.11)
RDW: 12.6 % (ref 11.5–15.5)
WBC: 9.3 10*3/uL (ref 4.0–10.5)
nRBC: 0 % (ref 0.0–0.2)

## 2023-08-30 LAB — RESP PANEL BY RT-PCR (RSV, FLU A&B, COVID)  RVPGX2
Influenza A by PCR: NEGATIVE
Influenza B by PCR: NEGATIVE
Resp Syncytial Virus by PCR: NEGATIVE
SARS Coronavirus 2 by RT PCR: NEGATIVE

## 2023-08-30 LAB — POC URINE PREG, ED: Preg Test, Ur: POSITIVE — AB

## 2023-08-30 LAB — LIPASE, BLOOD: Lipase: 50 U/L (ref 11–51)

## 2023-08-30 LAB — HCG, QUANTITATIVE, PREGNANCY: hCG, Beta Chain, Quant, S: 50542 m[IU]/mL — ABNORMAL HIGH (ref <5)

## 2023-08-30 NOTE — ED Triage Notes (Addendum)
Pt to ed from home via POV for vomiting and nauseated. Pt is currently pregnant. Pt thinks she is 5 months pregnant but hasn't had a confirmed ultrasound yet. Pt is caox4, in no acute distress and ambulatory in triage. LMP unknown. Pt states "I dont get those". Pt hasn't seen an OBGYN yet for confirmation.

## 2023-09-05 ENCOUNTER — Ambulatory Visit
Admission: RE | Admit: 2023-09-05 | Discharge: 2023-09-05 | Disposition: A | Discharge status: Home / Self Care | Payer: Medicaid Other | Source: Ambulatory Visit | Attending: Family Medicine | Admitting: Family Medicine

## 2023-09-05 DIAGNOSIS — Z3A37 37 weeks gestation of pregnancy: Secondary | ICD-10-CM | POA: Insufficient documentation

## 2023-09-05 DIAGNOSIS — Z3483 Encounter for supervision of other normal pregnancy, third trimester: Secondary | ICD-10-CM | POA: Diagnosis not present

## 2023-09-05 DIAGNOSIS — Z3480 Encounter for supervision of other normal pregnancy, unspecified trimester: Secondary | ICD-10-CM | POA: Diagnosis present

## 2023-09-11 ENCOUNTER — Other Ambulatory Visit: Payer: Self-pay

## 2023-09-11 ENCOUNTER — Observation Stay
Admission: EM | Admit: 2023-09-11 | Discharge: 2023-09-11 | Disposition: A | Discharge status: Home / Self Care | Payer: Medicaid Other | Attending: Certified Nurse Midwife | Admitting: Certified Nurse Midwife

## 2023-09-11 ENCOUNTER — Observation Stay: Payer: Medicaid Other

## 2023-09-11 DIAGNOSIS — Z3A37 37 weeks gestation of pregnancy: Secondary | ICD-10-CM | POA: Diagnosis not present

## 2023-09-11 DIAGNOSIS — R7309 Other abnormal glucose: Secondary | ICD-10-CM | POA: Diagnosis not present

## 2023-09-11 DIAGNOSIS — Z87891 Personal history of nicotine dependence: Secondary | ICD-10-CM | POA: Diagnosis not present

## 2023-09-11 DIAGNOSIS — O368130 Decreased fetal movements, third trimester, not applicable or unspecified: Secondary | ICD-10-CM | POA: Diagnosis present

## 2023-09-11 DIAGNOSIS — O99810 Abnormal glucose complicating pregnancy: Secondary | ICD-10-CM | POA: Diagnosis not present

## 2023-09-11 DIAGNOSIS — O368190 Decreased fetal movements, unspecified trimester, not applicable or unspecified: Secondary | ICD-10-CM | POA: Diagnosis present

## 2023-09-11 LAB — TYPE AND SCREEN
ABO/RH(D): O POS
Antibody Screen: NEGATIVE

## 2023-09-11 LAB — ABO/RH: ABO/RH(D): O POS

## 2023-09-11 LAB — CBC
HCT: 35.2 % — ABNORMAL LOW (ref 36.0–46.0)
Hemoglobin: 11.9 g/dL — ABNORMAL LOW (ref 12.0–15.0)
MCH: 28.3 pg (ref 26.0–34.0)
MCHC: 33.8 g/dL (ref 30.0–36.0)
MCV: 83.8 fL (ref 80.0–100.0)
Platelets: 289 10*3/uL (ref 150–400)
RBC: 4.2 MIL/uL (ref 3.87–5.11)
RDW: 12.5 % (ref 11.5–15.5)
WBC: 9.1 10*3/uL (ref 4.0–10.5)
nRBC: 0 % (ref 0.0–0.2)

## 2023-09-11 LAB — URINALYSIS, ROUTINE W REFLEX MICROSCOPIC
Bilirubin Urine: NEGATIVE
Glucose, UA: >=500 mg/dL — AB
Hgb urine dipstick: NEGATIVE
Ketones, ur: NEGATIVE mg/dL
Leukocytes,Ua: NEGATIVE
Nitrite: NEGATIVE
Protein, ur: 100 mg/dL — AB
Specific Gravity, Urine: 1.035 — ABNORMAL HIGH (ref 1.005–1.030)
pH: 5 (ref 5.0–8.0)

## 2023-09-11 LAB — OB RESULTS CONSOLE GC/CHLAMYDIA
Chlamydia: NEGATIVE
Neisseria Gonorrhea: NEGATIVE

## 2023-09-11 LAB — PROTEIN / CREATININE RATIO, URINE
Creatinine, Urine: 183 mg/dL
Protein Creatinine Ratio: 0.58 mg/mg{creat} — ABNORMAL HIGH (ref 0.00–0.15)
Total Protein, Urine: 107 mg/dL

## 2023-09-11 LAB — COMPREHENSIVE METABOLIC PANEL
ALT: 14 U/L (ref 0–44)
AST: 15 U/L (ref 15–41)
Albumin: 2.5 g/dL — ABNORMAL LOW (ref 3.5–5.0)
Alkaline Phosphatase: 93 U/L (ref 38–126)
Anion gap: 8 (ref 5–15)
BUN: 13 mg/dL (ref 6–20)
CO2: 23 mmol/L (ref 22–32)
Calcium: 8.2 mg/dL — ABNORMAL LOW (ref 8.9–10.3)
Chloride: 105 mmol/L (ref 98–111)
Creatinine, Ser: 0.73 mg/dL (ref 0.44–1.00)
GFR, Estimated: >60 mL/min (ref >60)
Glucose, Bld: 222 mg/dL — ABNORMAL HIGH (ref 70–99)
Potassium: 4.1 mmol/L (ref 3.5–5.1)
Sodium: 136 mmol/L (ref 135–145)
Total Bilirubin: 0.6 mg/dL (ref 0.0–1.2)
Total Protein: 5.6 g/dL — ABNORMAL LOW (ref 6.5–8.1)

## 2023-09-11 LAB — URINE DRUG SCREEN, QUALITATIVE (ARMC ONLY)
Amphetamines, Ur Screen: NOT DETECTED
Barbiturates, Ur Screen: NOT DETECTED
Benzodiazepine, Ur Scrn: NOT DETECTED
Cannabinoid 50 Ng, Ur ~~LOC~~: NOT DETECTED
Cocaine Metabolite,Ur ~~LOC~~: NOT DETECTED
MDMA (Ecstasy)Ur Screen: NOT DETECTED
Methadone Scn, Ur: NOT DETECTED
Opiate, Ur Screen: NOT DETECTED
Phencyclidine (PCP) Ur S: NOT DETECTED
Tricyclic, Ur Screen: NOT DETECTED

## 2023-09-11 LAB — RAPID HIV SCREEN (HIV 1/2 AB+AG)
HIV 1/2 Antibodies: NONREACTIVE
HIV-1 P24 Antigen - HIV24: NONREACTIVE

## 2023-09-11 LAB — HEPATITIS C ANTIBODY: HCV Ab: NEGATIVE

## 2023-09-11 LAB — CHLAMYDIA/NGC RT PCR (ARMC ONLY)
Chlamydia Tr: NOT DETECTED
N gonorrhoeae: NOT DETECTED

## 2023-09-11 LAB — GLUCOSE, CAPILLARY: Glucose-Capillary: 184 mg/dL — ABNORMAL HIGH (ref 70–99)

## 2023-09-11 LAB — OB RESULTS CONSOLE GBS: GBS: NEGATIVE

## 2023-09-11 LAB — OB RESULTS CONSOLE VARICELLA ZOSTER ANTIBODY, IGG: Varicella: IMMUNE

## 2023-09-11 LAB — OB RESULTS CONSOLE HIV ANTIBODY (ROUTINE TESTING): HIV: NONREACTIVE

## 2023-09-11 LAB — OB RESULTS CONSOLE RUBELLA ANTIBODY, IGM: Rubella: IMMUNE

## 2023-09-11 LAB — OB RESULTS CONSOLE HEPATITIS B SURFACE ANTIGEN: Hepatitis B Surface Ag: NEGATIVE

## 2023-09-11 MED ORDER — LACTATED RINGERS IV BOLUS
1000.0000 mL | Freq: Once | INTRAVENOUS | Status: AC
  Administered 2023-09-11: 1000 mL via INTRAVENOUS

## 2023-09-11 NOTE — Discharge Summary (Addendum)
 [name] is a 28 y.o. female. She is at [redacted]w[redacted]d gestation. No LMP recorded. Patient is pregnant. Estimated Date of Delivery: 09/26/23  [facility] care site: [facility]  Chief complaint: Decreased Fetal Movement   HPI: [name] was sent over to L+D triage from [facility] office for concerns of non-reactive NST. Patient has limited prenatal care and her first prenatal visit was on 09/11/2023. Desires repeat c-section. Patient reports decreased fetal movement in past few days, occasional Braxton Hicks contractions. Denies vaginal bleeding and leakage of fluid.   Factors complicating pregnancy: Late to Prenatal Care Obesity- BMI: 50 Major Depressive Disorder H/o tobacco and marijuana use  H/o GDM (2017)  S: Resting comfortably. Denies feeling contractions, no vaginal bleeding, and no leakage of fluid.   Maternal Medical History:  Past Medical Hx:  has a past medical history of Anxiety, Depression, Gestational diabetes, Gestational diabetes mellitus (GDM) affecting pregnancy, antepartum (2017), and Sleep apnea.    Past Surgical Hx:  has a past surgical history that includes Tonsillectomy and Cesarean section (N/A, 04/26/2016).   No Known Allergies   Prior to Admission medications   Medication Sig Start Date End Date Taking? Authorizing Provider  Prenatal Vit-Fe Fumarate-FA (MULTIVITAMIN-PRENATAL) 27-0.8 MG TABS tablet Take 1 tablet by mouth daily at 12 noon.   Yes [provider]    Social History: She  reports that she has quit smoking. Her smoking use included cigarettes. She has quit using smokeless tobacco. She reports that she does not drink alcohol and does not use drugs.  Family History: family history is not on file. No history of gyn cancers  Review of Systems:  Review of Systems  Constitutional: Negative.   Respiratory: Negative.    Cardiovascular: Negative.   Gastrointestinal: Negative.   Genitourinary: Negative.   Psychiatric/Behavioral: Negative.        O:  BP (!) 145/81 (BP Location: Left Arm)   Pulse 83   Temp 98.4 F (36.9 C) (Oral)   Resp 18   Ht 5' 4 (1.626 m)   Wt 127 kg   BMI 48.06 kg/m  Results for orders placed or performed during the hospital encounter of 09/11/23 (from the past 48 hours)  Comprehensive metabolic panel   Collection Time: 09/11/23  7:21 PM  Result Value Ref Range   Sodium 136 135 - 145 mmol/L   Potassium 4.1 3.5 - 5.1 mmol/L   Chloride 105 98 - 111 mmol/L   CO2 23 22 - 32 mmol/L   Glucose, Bld 222 (H) 70 - 99 mg/dL   BUN 13 6 - 20 mg/dL   Creatinine, Ser 9.26 0.44 - 1.00 mg/dL   Calcium 8.2 (L) 8.9 - 10.3 mg/dL   Total Protein 5.6 (L) 6.5 - 8.1 g/dL   Albumin 2.5 (L) 3.5 - 5.0 g/dL   AST 15 15 - 41 U/L   ALT 14 0 - 44 U/L   Alkaline Phosphatase 93 38 - 126 U/L   Total Bilirubin 0.6 0.0 - 1.2 mg/dL   GFR, Estimated >39 >39 mL/min   Anion gap 8 5 - 15  Rapid HIV screen (HIV 1/2 Ab+Ag)   Collection Time: 09/11/23  7:21 PM  Result Value Ref Range   HIV-1 P24 Antigen - HIV24 NON REACTIVE NON REACTIVE   HIV 1/2 Antibodies NON REACTIVE NON REACTIVE   Interpretation (HIV Ag Ab)      A non reactive test result means that HIV 1 or HIV 2 antibodies and HIV 1 p24 antigen were  not detected in the specimen.  CBC   Collection Time: 09/11/23  7:21 PM  Result Value Ref Range   WBC 9.1 4.0 - 10.5 K/uL   RBC 4.20 3.87 - 5.11 MIL/uL   Hemoglobin 11.9 (L) 12.0 - 15.0 g/dL   HCT 64.7 (L) 63.9 - 53.9 %   MCV 83.8 80.0 - 100.0 fL   MCH 28.3 26.0 - 34.0 pg   MCHC 33.8 30.0 - 36.0 g/dL   RDW 87.4 88.4 - 84.4 %   Platelets 289 150 - 400 K/uL   nRBC 0.0 0.0 - 0.2 %  Type and screen [facility]   Collection Time: 09/11/23  7:21 PM  Result Value Ref Range   ABO/RH(D) O POS    Antibody Screen NEG    Sample Expiration      09/14/2023,2359 Performed at [facility], [address] [place], [place] [address]   Urinalysis, Routine w reflex microscopic -Urine, Clean Catch    Collection Time: 09/11/23  7:22 PM  Result Value Ref Range   Color, Urine YELLOW (A) YELLOW   APPearance HAZY (A) CLEAR   Specific Gravity, Urine 1.035 (H) 1.005 - 1.030   pH 5.0 5.0 - 8.0   Glucose, UA >=500 (A) NEGATIVE mg/dL   Hgb urine dipstick NEGATIVE NEGATIVE   Bilirubin Urine NEGATIVE NEGATIVE   Ketones, ur NEGATIVE NEGATIVE mg/dL   Protein, ur 899 (A) NEGATIVE mg/dL   Nitrite NEGATIVE NEGATIVE   Leukocytes,Ua NEGATIVE NEGATIVE   RBC / HPF 0-5 0 - 5 RBC/hpf   WBC, UA 0-5 0 - 5 WBC/hpf   Bacteria, UA RARE (A) NONE SEEN   Squamous Epithelial / HPF 6-10 0 - 5 /HPF   Mucus PRESENT   Urine Drug Screen, Qualitative ([facility] only)   Collection Time: 09/11/23  7:22 PM  Result Value Ref Range   Tricyclic, Ur Screen NONE DETECTED NONE DETECTED   Amphetamines, Ur Screen NONE DETECTED NONE DETECTED   MDMA (Ecstasy)Ur Screen NONE DETECTED NONE DETECTED   Cocaine Metabolite,Ur [place] NONE DETECTED NONE DETECTED   Opiate, Ur Screen NONE DETECTED NONE DETECTED   Phencyclidine (PCP) Ur S NONE DETECTED NONE DETECTED   Cannabinoid 50 Ng, Ur [place] NONE DETECTED NONE DETECTED   Barbiturates, Ur Screen NONE DETECTED NONE DETECTED   Benzodiazepine, Ur Scrn NONE DETECTED NONE DETECTED   Methadone Scn, Ur NONE DETECTED NONE DETECTED  Protein / creatinine ratio, urine   Collection Time: 09/11/23  7:22 PM  Result Value Ref Range   Creatinine, Urine 183 mg/dL   Total Protein, Urine 107 mg/dL   Protein Creatinine Ratio 0.58 (H) 0.00 - 0.15 mg/mg[Cre]  ABO/Rh   Collection Time: 09/11/23  7:28 PM  Result Value Ref Range   ABO/RH(D)      O POS Performed at [facility], [address] [place], [place] [address]   Glucose, capillary   Collection Time: 09/11/23 10:01 PM  Result Value Ref Range   Glucose-Capillary 184 (H) 70 - 99 mg/dL     Constitutional: NAD, AAOx3  HE/ENT: extraocular movements grossly intact, moist mucous membranes CV: RRR PULM: nl respiratory effort, CTABL Abd:  gravid, non-tender, non-distended, soft  Ext: Non-tender, Nonedmeatous Psych: mood appropriate, speech normal Pelvic : Deferred  SVE:  Deferred   NST  Baseline: 150-155 bpm Variability: moderate Accels: Present Decels: none Toco: irregular Category: I  Interpretation:  INDICATIONS: Decreased fetal movement RESULTS:  A NST procedure was performed with FHR monitoring and a normal  baseline established, appropriate time of 40+ minutes of evaluation, and accels >2 seen w 15x15 characteristics.  Results show a REACTIVE NST.   Assessment: 28 y.o. [redacted]w[redacted]d here for antenatal surveillance during pregnancy.  Principle diagnosis:  The encounter diagnosis was Decreased fetal movements in third trimester, single or unspecified fetus.   Plan: Antenatal surveillance  Labor: Not present. US  and toco placed on patient. FHR tracing observed for approximately 3 hours. Irregular contractions observed. Denies feeling contractions.   2. Decreased Fetal Movement  FHR monitored for approximately 3 hours. 15 x 15  accelerations observed when baseline 150 bpm. Baseline fluctuated between 150-155 bpm with moderate variability. No decelerations observed. Will plan for patient to return on 09/14/2023 for a NST for additional fetal surveillance.  LR Bolus 1,000 mL given.  US  OB Limited ordered (09/11/2023)   - AFI: 18.7 cm   - Presentation: Cephalic    - Placenta Location: Posterior US  Fetal BPP w/o non stress test ordered (09/11/2023); Results: WNL    - BPP 8/8 Fetal Wellbeing: Reassuring Cat 1 tracing. Reactive NST  3. Late to Prenatal Care  US  OB Limited ordered (09/11/2023)   - Anatomy US  unable to be completed d/t advanced gestation Prenatal labs collected accordingly to gestation (09/11/2023)   - CBC; Results: WNL    - Hgb: 11.9    - Platelets: 289    - WBC: 9.1    - RBC: 4.20   - CMP; Results at follows:    - Glucose: 222    - AST/ALT: 15/14   - HgbA1c: Pending    - Prot/CreaU ratio:  0.58    - Denies signs and symptoms of Pre-E.    - BP in L&D triage--> 124-137/74-80   - Rapid HIV: Negative    - Urinalysis; Results as follows:    - Glucose >= 500    - Nitrites: Neg    - Protein: 100   - CBG: 184    - Per patient, she had [employer] before arrival to L&D triage   - UDS: Negative    - Varicella zoster antibody: Pending    - GBS: Pending    - Chlamydia/NGC: Pending    - Type & Screen: O Pos  - NST scheduled for 02/08 at 1000. Patient agreed with plan of care and verbalized     understanding.  - Repeat cesarean section planned for 02/10 at 0930 with SDJ d/t elevated blood    glucose levels  - D/c home stable, precautions reviewed, follow-up as scheduled.  - Plan of care reviewed with S.D. Leonce, MD.   ----- Noel Henandez, CNM Certified Nurse Midwife [facility]

## 2023-09-11 NOTE — Progress Notes (Signed)
 Pt up to ultrasound

## 2023-09-11 NOTE — Discharge Instructions (Addendum)
 Please RETURN on SATURDAY, September 14, 2023 at 10:00 AM for a NST.    Repeat C-section scheduled for [facility], September 16, 2023 at 9:30 AM  Come back if you have the following symptoms: headache that doesn't get resolved after tylenol , blurry vision, atypical edema

## 2023-09-11 NOTE — Progress Notes (Signed)
 Discharge instructions provided to patient. Patient verbalized understanding. Pt educated on signs and symptoms of labor, vaginal bleeding, LOF, and fetal movement. Red flag signs reviewed by RN. Pt informed about upcoming NST appointment and scheduled c/s. Patient discharged home with mother in stable condition.

## 2023-09-11 NOTE — OB Triage Note (Signed)
 Patient is a G2P0101 at [redacted]w[redacted]d who was sent over from office for non-reactive NST. Patient has very limited prenatal care since today was first prenatal visit in office. Reports some decreased fetal movement in the last few days, occasional braxton hicks contractions. Denies vaginal bleeding and leakage of fluid. External monitors applied and assessing. Initial FHT 165. Vital signs WDL.

## 2023-09-12 LAB — HEMOGLOBIN A1C
Hgb A1c MFr Bld: 8.9 % — ABNORMAL HIGH (ref 4.8–5.6)
Mean Plasma Glucose: 208.73 mg/dL

## 2023-09-12 LAB — RPR: RPR Ser Ql: NONREACTIVE

## 2023-09-13 ENCOUNTER — Other Ambulatory Visit: Payer: Self-pay

## 2023-09-13 ENCOUNTER — Other Ambulatory Visit: Payer: Self-pay | Admitting: Obstetrics and Gynecology

## 2023-09-13 ENCOUNTER — Inpatient Hospital Stay: Payer: Medicaid Other | Admitting: Anesthesiology

## 2023-09-13 ENCOUNTER — Encounter
Admission: RE | Disposition: A | Discharge status: Home / Self Care | Payer: Self-pay | Source: Home / Self Care | Attending: Obstetrics

## 2023-09-13 ENCOUNTER — Inpatient Hospital Stay
Admission: RE | Admit: 2023-09-13 | Discharge: 2023-09-16 | DRG: 783 | Disposition: A | Discharge status: Home / Self Care | Payer: Medicaid Other | Attending: Obstetrics | Admitting: Obstetrics

## 2023-09-13 ENCOUNTER — Encounter: Payer: Self-pay | Admitting: Obstetrics and Gynecology

## 2023-09-13 DIAGNOSIS — O9081 Anemia of the puerperium: Secondary | ICD-10-CM | POA: Diagnosis not present

## 2023-09-13 DIAGNOSIS — Z302 Encounter for sterilization: Secondary | ICD-10-CM

## 2023-09-13 DIAGNOSIS — O99210 Obesity complicating pregnancy, unspecified trimester: Secondary | ICD-10-CM | POA: Diagnosis present

## 2023-09-13 DIAGNOSIS — D62 Acute posthemorrhagic anemia: Secondary | ICD-10-CM | POA: Diagnosis not present

## 2023-09-13 DIAGNOSIS — Z7984 Long term (current) use of oral hypoglycemic drugs: Secondary | ICD-10-CM | POA: Diagnosis not present

## 2023-09-13 DIAGNOSIS — O99214 Obesity complicating childbirth: Secondary | ICD-10-CM | POA: Diagnosis present

## 2023-09-13 DIAGNOSIS — O34211 Maternal care for low transverse scar from previous cesarean delivery: Principal | ICD-10-CM | POA: Diagnosis present

## 2023-09-13 DIAGNOSIS — O3663X0 Maternal care for excessive fetal growth, third trimester, not applicable or unspecified: Secondary | ICD-10-CM | POA: Diagnosis present

## 2023-09-13 DIAGNOSIS — Z8659 Personal history of other mental and behavioral disorders: Secondary | ICD-10-CM

## 2023-09-13 DIAGNOSIS — O34219 Maternal care for unspecified type scar from previous cesarean delivery: Secondary | ICD-10-CM | POA: Diagnosis present

## 2023-09-13 DIAGNOSIS — Z01818 Encounter for other preprocedural examination: Principal | ICD-10-CM

## 2023-09-13 DIAGNOSIS — E1165 Type 2 diabetes mellitus with hyperglycemia: Secondary | ICD-10-CM | POA: Diagnosis present

## 2023-09-13 DIAGNOSIS — Z3A38 38 weeks gestation of pregnancy: Secondary | ICD-10-CM

## 2023-09-13 DIAGNOSIS — O24419 Gestational diabetes mellitus in pregnancy, unspecified control: Secondary | ICD-10-CM | POA: Diagnosis present

## 2023-09-13 DIAGNOSIS — O368130 Decreased fetal movements, third trimester, not applicable or unspecified: Secondary | ICD-10-CM | POA: Diagnosis present

## 2023-09-13 DIAGNOSIS — O0933 Supervision of pregnancy with insufficient antenatal care, third trimester: Secondary | ICD-10-CM

## 2023-09-13 DIAGNOSIS — E66813 Obesity, class 3: Secondary | ICD-10-CM | POA: Diagnosis present

## 2023-09-13 DIAGNOSIS — O2412 Pre-existing diabetes mellitus, type 2, in childbirth: Secondary | ICD-10-CM | POA: Diagnosis present

## 2023-09-13 DIAGNOSIS — Z98891 History of uterine scar from previous surgery: Secondary | ICD-10-CM

## 2023-09-13 LAB — TYPE AND SCREEN
ABO/RH(D): O POS
Antibody Screen: NEGATIVE

## 2023-09-13 LAB — GLUCOSE, CAPILLARY: Glucose-Capillary: 91 mg/dL (ref 70–99)

## 2023-09-13 SURGERY — Surgical Case
Anesthesia: Spinal | Laterality: Bilateral

## 2023-09-13 MED ORDER — KETOROLAC TROMETHAMINE 30 MG/ML IJ SOLN: 30.0000 mg | Freq: Four times a day (QID) | INTRAMUSCULAR | Status: DC

## 2023-09-13 MED ORDER — ACETAMINOPHEN 500 MG PO TABS
1000.0000 mg | ORAL_TABLET | Freq: Once | ORAL | Status: AC
Start: 2023-09-13 — End: 2023-09-13
  Administered 2023-09-13: 1000 mg via ORAL
  Filled 2023-09-13: qty 2

## 2023-09-13 MED ORDER — CEFAZOLIN IN SODIUM CHLORIDE 3-0.9 GM/100ML-% IV SOLN
3.0000 g | INTRAVENOUS | Status: AC
  Administered 2023-09-13: 3 g via INTRAVENOUS
  Filled 2023-09-13: qty 100

## 2023-09-13 MED ORDER — LACTATED RINGERS IV BOLUS
500.0000 mL | Freq: Once | INTRAVENOUS | Status: AC
  Administered 2023-09-13: 500 mL via INTRAVENOUS

## 2023-09-13 MED ORDER — SOD CITRATE-CITRIC ACID 500-334 MG/5ML PO SOLN
ORAL | Status: AC
  Administered 2023-09-13: 30 mL via ORAL
  Filled 2023-09-13: qty 15

## 2023-09-13 MED ORDER — ACETAMINOPHEN 500 MG PO TABS
1000.0000 mg | ORAL_TABLET | Freq: Four times a day (QID) | ORAL | Status: AC
  Administered 2023-09-13 – 2023-09-14 (×3): 1000 mg via ORAL
  Filled 2023-09-13 (×2): qty 2

## 2023-09-13 MED ORDER — OXYTOCIN-SODIUM CHLORIDE 30-0.9 UT/500ML-% IV SOLN: INTRAVENOUS | Status: DC | PRN

## 2023-09-13 MED ORDER — MENTHOL 3 MG MT LOZG: 1.0000 | LOZENGE | OROMUCOSAL | Status: DC | PRN

## 2023-09-13 MED ORDER — DIBUCAINE (PERIANAL) 1 % EX OINT: 1.0000 | TOPICAL_OINTMENT | CUTANEOUS | Status: DC | PRN

## 2023-09-13 MED ORDER — KETOROLAC TROMETHAMINE 30 MG/ML IJ SOLN
30.0000 mg | Freq: Four times a day (QID) | INTRAMUSCULAR | Status: AC
  Administered 2023-09-14 (×3): 30 mg via INTRAVENOUS
  Filled 2023-09-13 (×3): qty 1

## 2023-09-13 MED ORDER — ACETAMINOPHEN 500 MG PO TABS
1000.0000 mg | ORAL_TABLET | Freq: Once | ORAL | Status: DC
Start: 2023-09-13 — End: 2023-09-13

## 2023-09-13 MED ORDER — GABAPENTIN 300 MG PO CAPS: 300.0000 mg | ORAL_CAPSULE | Freq: Once | ORAL | Status: DC

## 2023-09-13 MED ORDER — CEFAZOLIN SODIUM-DEXTROSE 3-4 GM/150ML-% IV SOLN
3.0000 g | INTRAVENOUS | Status: DC
  Filled 2023-09-13: qty 150

## 2023-09-13 MED ORDER — FENTANYL CITRATE (PF) 100 MCG/2ML IJ SOLN
INTRAMUSCULAR | Status: AC
  Filled 2023-09-13: qty 2

## 2023-09-13 MED ORDER — NALOXONE HCL 4 MG/10ML IJ SOLN: 1.0000 ug/kg/h | INTRAMUSCULAR | Status: DC | PRN

## 2023-09-13 MED ORDER — PHENYLEPHRINE HCL (PRESSORS) 10 MG/ML IV SOLN
INTRAVENOUS | Status: AC
  Filled 2023-09-13: qty 1

## 2023-09-13 MED ORDER — OXYTOCIN-SODIUM CHLORIDE 30-0.9 UT/500ML-% IV SOLN
INTRAVENOUS | Status: AC
  Filled 2023-09-13: qty 1000

## 2023-09-13 MED ORDER — IBUPROFEN 600 MG PO TABS
600.0000 mg | ORAL_TABLET | Freq: Four times a day (QID) | ORAL | Status: DC
  Administered 2023-09-14 – 2023-09-16 (×7): 600 mg via ORAL
  Filled 2023-09-13 (×7): qty 1

## 2023-09-13 MED ORDER — SIMETHICONE 80 MG PO CHEW
80.0000 mg | CHEWABLE_TABLET | Freq: Three times a day (TID) | ORAL | Status: DC
  Administered 2023-09-14 – 2023-09-16 (×7): 80 mg via ORAL
  Filled 2023-09-13 (×7): qty 1

## 2023-09-13 MED ORDER — SOD CITRATE-CITRIC ACID 500-334 MG/5ML PO SOLN: 30.0000 mL | ORAL | Status: DC

## 2023-09-13 MED ORDER — CHLORHEXIDINE GLUCONATE 0.12 % MT SOLN
OROMUCOSAL | Status: AC
  Administered 2023-09-13: 15 mL
  Filled 2023-09-13: qty 15

## 2023-09-13 MED ORDER — ENOXAPARIN SODIUM 40 MG/0.4ML IJ SOSY
40.0000 mg | PREFILLED_SYRINGE | INTRAMUSCULAR | Status: DC
  Administered 2023-09-14 – 2023-09-15 (×2): 40 mg via SUBCUTANEOUS
  Filled 2023-09-13 (×2): qty 0.4

## 2023-09-13 MED ORDER — SODIUM CHLORIDE 0.9% FLUSH: 3.0000 mL | INTRAVENOUS | Status: DC | PRN

## 2023-09-13 MED ORDER — DEXAMETHASONE SODIUM PHOSPHATE 10 MG/ML IJ SOLN
INTRAMUSCULAR | Status: DC | PRN
  Administered 2023-09-13: 10 mg via INTRAVENOUS

## 2023-09-13 MED ORDER — ACETAMINOPHEN 500 MG PO TABS
1000.0000 mg | ORAL_TABLET | Freq: Four times a day (QID) | ORAL | Status: DC
  Administered 2023-09-14 – 2023-09-16 (×7): 1000 mg via ORAL
  Filled 2023-09-13 (×8): qty 2

## 2023-09-13 MED ORDER — OXYTOCIN-SODIUM CHLORIDE 30-0.9 UT/500ML-% IV SOLN
INTRAVENOUS | Status: DC | PRN
  Administered 2023-09-13: 500 mL/h via INTRAVENOUS

## 2023-09-13 MED ORDER — NALOXONE HCL 0.4 MG/ML IJ SOLN: 0.4000 mg | INTRAMUSCULAR | Status: DC | PRN

## 2023-09-13 MED ORDER — KETOROLAC TROMETHAMINE 30 MG/ML IJ SOLN
30.0000 mg | Freq: Four times a day (QID) | INTRAMUSCULAR | Status: AC
  Filled 2023-09-13: qty 1

## 2023-09-13 MED ORDER — PROPOFOL 10 MG/ML IV BOLUS
INTRAVENOUS | Status: AC
  Filled 2023-09-13: qty 20

## 2023-09-13 MED ORDER — CEFAZOLIN IN SODIUM CHLORIDE 3-0.9 GM/100ML-% IV SOLN
INTRAVENOUS | Status: AC
  Filled 2023-09-13: qty 100

## 2023-09-13 MED ORDER — GABAPENTIN 300 MG PO CAPS
300.0000 mg | ORAL_CAPSULE | Freq: Every day | ORAL | Status: DC
  Administered 2023-09-14 – 2023-09-15 (×2): 300 mg via ORAL
  Filled 2023-09-13 (×3): qty 1

## 2023-09-13 MED ORDER — COCONUT OIL OIL: 1.0000 | TOPICAL_OIL | Status: DC | PRN

## 2023-09-13 MED ORDER — DIPHENHYDRAMINE HCL 50 MG/ML IJ SOLN: 12.5000 mg | INTRAMUSCULAR | Status: DC | PRN

## 2023-09-13 MED ORDER — 0.9 % SODIUM CHLORIDE (POUR BTL) OPTIME
TOPICAL | Status: DC | PRN
  Administered 2023-09-13: 400 mL

## 2023-09-13 MED ORDER — PHENYLEPHRINE HCL-NACL 20-0.9 MG/250ML-% IV SOLN
INTRAVENOUS | Status: DC | PRN
  Administered 2023-09-13: 50 ug/min via INTRAVENOUS

## 2023-09-13 MED ORDER — MORPHINE SULFATE (PF) 0.5 MG/ML IJ SOLN
INTRAMUSCULAR | Status: DC | PRN
  Administered 2023-09-13: .1 mg via EPIDURAL

## 2023-09-13 MED ORDER — KETOROLAC TROMETHAMINE 30 MG/ML IJ SOLN: 30.0000 mg | Freq: Four times a day (QID) | INTRAMUSCULAR | Status: AC

## 2023-09-13 MED ORDER — MORPHINE SULFATE (PF) 0.5 MG/ML IJ SOLN
INTRAMUSCULAR | Status: AC
  Filled 2023-09-13: qty 10

## 2023-09-13 MED ORDER — OXYTOCIN-SODIUM CHLORIDE 30-0.9 UT/500ML-% IV SOLN: 2.5000 [IU]/h | INTRAVENOUS | Status: AC

## 2023-09-13 MED ORDER — BUPIVACAINE IN DEXTROSE 0.75-8.25 % IT SOLN
INTRATHECAL | Status: DC | PRN
  Administered 2023-09-13: 1.5 mL via INTRATHECAL

## 2023-09-13 MED ORDER — DIPHENHYDRAMINE HCL 25 MG PO CAPS: 25.0000 mg | ORAL_CAPSULE | Freq: Four times a day (QID) | ORAL | Status: DC | PRN

## 2023-09-13 MED ORDER — ACETAMINOPHEN 500 MG PO TABS: 1000.0000 mg | ORAL_TABLET | Freq: Four times a day (QID) | ORAL | Status: DC

## 2023-09-13 MED ORDER — SODIUM CHLORIDE 0.9% FLUSH
INTRAVENOUS | Status: DC | PRN
  Administered 2023-09-13: 20 mL via INTRAVENOUS

## 2023-09-13 MED ORDER — OXYCODONE HCL 5 MG PO TABS: 5.0000 mg | ORAL_TABLET | ORAL | Status: DC | PRN

## 2023-09-13 MED ORDER — SENNOSIDES-DOCUSATE SODIUM 8.6-50 MG PO TABS
2.0000 | ORAL_TABLET | Freq: Every day | ORAL | Status: DC
  Administered 2023-09-15 – 2023-09-16 (×2): 2 via ORAL
  Filled 2023-09-13 (×2): qty 2

## 2023-09-13 MED ORDER — SIMETHICONE 80 MG PO CHEW: 80.0000 mg | CHEWABLE_TABLET | ORAL | Status: DC | PRN

## 2023-09-13 MED ORDER — LACTATED RINGERS IV SOLN: INTRAVENOUS | Status: AC

## 2023-09-13 MED ORDER — GABAPENTIN 300 MG PO CAPS
300.0000 mg | ORAL_CAPSULE | Freq: Once | ORAL | Status: AC
  Administered 2023-09-13: 300 mg via ORAL
  Filled 2023-09-13: qty 1

## 2023-09-13 MED ORDER — ONDANSETRON HCL 4 MG/2ML IJ SOLN
INTRAMUSCULAR | Status: DC | PRN
  Administered 2023-09-13: 4 mg via INTRAVENOUS

## 2023-09-13 MED ORDER — ONDANSETRON HCL 4 MG/2ML IJ SOLN: 4.0000 mg | Freq: Three times a day (TID) | INTRAMUSCULAR | Status: DC | PRN

## 2023-09-13 MED ORDER — FENTANYL CITRATE (PF) 100 MCG/2ML IJ SOLN
INTRAMUSCULAR | Status: DC | PRN
  Administered 2023-09-13: 15 ug via INTRATHECAL

## 2023-09-13 MED ORDER — PRENATAL MULTIVITAMIN CH
1.0000 | ORAL_TABLET | Freq: Every day | ORAL | Status: DC
  Administered 2023-09-14 – 2023-09-16 (×3): 1 via ORAL
  Filled 2023-09-13 (×3): qty 1

## 2023-09-13 MED ORDER — WITCH HAZEL-GLYCERIN EX PADS: 1.0000 | MEDICATED_PAD | CUTANEOUS | Status: DC | PRN

## 2023-09-13 MED ORDER — SODIUM CHLORIDE 0.9 % IV SOLN: 500.0000 mg | INTRAVENOUS | Status: DC

## 2023-09-13 MED ORDER — DIPHENHYDRAMINE HCL 25 MG PO CAPS: 25.0000 mg | ORAL_CAPSULE | ORAL | Status: DC | PRN

## 2023-09-13 MED ORDER — KETOROLAC TROMETHAMINE 30 MG/ML IJ SOLN
INTRAMUSCULAR | Status: DC | PRN
  Administered 2023-09-13: 30 mg via INTRAVENOUS

## 2023-09-13 MED ORDER — SOD CITRATE-CITRIC ACID 500-334 MG/5ML PO SOLN: 30.0000 mL | ORAL | Status: AC

## 2023-09-13 MED ORDER — SCOPOLAMINE 1 MG/3DAYS TD PT72
1.0000 | MEDICATED_PATCH | Freq: Once | TRANSDERMAL | Status: DC
  Administered 2023-09-13: 1.5 mg via TRANSDERMAL
  Filled 2023-09-13: qty 1

## 2023-09-13 MED ORDER — BUPIVACAINE HCL (PF) 0.25 % IJ SOLN
INTRAMUSCULAR | Status: AC
  Filled 2023-09-13: qty 60

## 2023-09-13 MED ORDER — ZOLPIDEM TARTRATE 5 MG PO TABS
5.0000 mg | ORAL_TABLET | Freq: Every evening | ORAL | Status: DC | PRN
Start: 2023-09-13 — End: 2023-09-16

## 2023-09-13 MED ORDER — MORPHINE SULFATE (PF) 2 MG/ML IV SOLN: 1.0000 mg | INTRAVENOUS | Status: DC | PRN

## 2023-09-13 MED ORDER — BUPIVACAINE HCL (PF) 0.25 % IJ SOLN
INTRAMUSCULAR | Status: DC | PRN
  Administered 2023-09-13: 60 mL

## 2023-09-13 SURGICAL SUPPLY — 32 items
BARRIER ADHS 3X4 INTERCEED (GAUZE/BANDAGES/DRESSINGS) ×1 IMPLANT
CHLORAPREP W/TINT 26 (MISCELLANEOUS) ×1 IMPLANT
DRSG TELFA 3X8 NADH STRL (GAUZE/BANDAGES/DRESSINGS) ×1 IMPLANT
ELECT CAUTERY BLADE 6.4 (BLADE) ×1 IMPLANT
ELECT REM PT RETURN 9FT ADLT (ELECTROSURGICAL) ×1
ELECTRODE REM PT RTRN 9FT ADLT (ELECTROSURGICAL) ×1 IMPLANT
GAUZE SPONGE 4X4 12PLY STRL (GAUZE/BANDAGES/DRESSINGS) ×1 IMPLANT
GLOVE SURG SYN 8.0 (GLOVE) ×1 IMPLANT
GLOVE SURG SYN 8.0 PF PI (GLOVE) ×1 IMPLANT
GOWN STRL REUS W/ TWL LRG LVL3 (GOWN DISPOSABLE) ×2 IMPLANT
GOWN STRL REUS W/ TWL XL LVL3 (GOWN DISPOSABLE) ×1 IMPLANT
MANIFOLD NEPTUNE II (INSTRUMENTS) ×1 IMPLANT
MAT PREVALON FULL STRYKER (MISCELLANEOUS) ×1 IMPLANT
NDL HYPO 22X1.5 SAFETY MO (MISCELLANEOUS) ×1 IMPLANT
NEEDLE HYPO 22X1.5 SAFETY MO (MISCELLANEOUS) ×1 IMPLANT
NS IRRIG 1000ML POUR BTL (IV SOLUTION) ×1 IMPLANT
PACK C SECTION AR (MISCELLANEOUS) ×1 IMPLANT
PAD OB MATERNITY 11 LF (PERSONAL CARE ITEMS) ×1 IMPLANT
PAD PREP OB/GYN DISP 24X41 (PERSONAL CARE ITEMS) ×1 IMPLANT
RETRACTOR TRAXI PANNICULUS (MISCELLANEOUS) IMPLANT
SCRUB CHG 4% DYNA-HEX 4OZ (MISCELLANEOUS) ×1 IMPLANT
STRAP SAFETY 5IN WIDE (MISCELLANEOUS) ×1 IMPLANT
SUCT VACUUM KIWI BELL (SUCTIONS) IMPLANT
SUT CHROMIC 1 CTX 36 (SUTURE) ×3 IMPLANT
SUT CHROMIC 2 0 SH (SUTURE) IMPLANT
SUT PLAIN GUT 0 (SUTURE) ×2 IMPLANT
SUT VIC AB 0 CT1 36 (SUTURE) ×2 IMPLANT
SUT VIC AB 2-0 SH 27XBRD (SUTURE) IMPLANT
SYR 30ML LL (SYRINGE) ×2 IMPLANT
TAPE PAPER 3X10 WHT MICROPORE (GAUZE/BANDAGES/DRESSINGS) IMPLANT
TRAP FLUID SMOKE EVACUATOR (MISCELLANEOUS) ×1 IMPLANT
WATER STERILE IRR 500ML POUR (IV SOLUTION) ×1 IMPLANT

## 2023-09-13 NOTE — Transfer of Care (Signed)
 Immediate Anesthesia Transfer of Care Note  Patient: [name]  Procedure(s) Performed: REPEAT CESAREAN SECTION WITH BILATERAL TUBAL LIGATION (Bilateral)  Patient Location: Mother/Baby  Anesthesia Type:Spinal  Level of Consciousness: awake, alert , and oriented  Airway & Oxygen Therapy: Patient Spontanous Breathing  Post-op Assessment: Report given to RN and Post -op Vital signs reviewed and stable  Post vital signs: Reviewed and stable  Last Vitals:  Vitals Value Taken Time  BP    Temp 36.6 C 09/13/23 1806  Pulse    Resp    SpO2      Last Pain:  Vitals:   09/13/23 1806  TempSrc: Oral  PainSc:          Complications: No notable events documented.

## 2023-09-13 NOTE — Brief Op Note (Signed)
 09/13/2023  5:58 PM  PATIENT:  [name]  28 y.o. female  PRE-OPERATIVE DIAGNOSIS:  uncontrolled diabetes, elective repeat, sterilization  POST-OPERATIVE DIAGNOSIS:  uncontrolled diabetes, elective repeat, sterilization  PROCEDURE:  Procedure(s): REPEAT CESAREAN SECTION WITH BILATERAL TUBAL LIGATION (Bilateral)  SURGEON:  Surgeons and Role:    * Aslynn Brunetti, Debby PARAS, MD - Primary  PHYSICIAN ASSISTANT: Therisa pillow, CNM    ASSISTANTS: PA student Caryl   ANESTHESIA:   spinal  EBL: QBL : 803 mL IOF 1200 cc  UO 75 cc  BLOOD ADMINISTERED:none  DRAINS: Urinary Catheter (Foley)   LOCAL MEDICATIONS USED:  MARCAINE      SPECIMEN:  Source of Specimen:  portion right and left tube  DISPOSITION OF SPECIMEN:  PATHOLOGY  COUNTS:  YES  TOURNIQUET:  * No tourniquets in log *  DICTATION: .Other Dictation: Dictation Number verbal  PLAN OF CARE: Admit to inpatient   PATIENT DISPOSITION:  PACU - hemodynamically stable.   Delay start of Pharmacological VTE agent (>24hrs) due to surgical blood loss or risk of bleeding: not applicable

## 2023-09-13 NOTE — Discharge Summary (Signed)
 ** Consulted with DM coordinator prior to discharge - will discharge with rx for a glucometer and Metformin  500mg  BID    Postpartum Discharge Summary  Patient Name: [name] DOB: [birth year] MRN: [id number]  Date of admission: 09/13/2023 Delivery date:09/13/2023 Delivering provider: LOVETTA DEBBY PARAS Date of discharge: 09/16/2023  Primary OB: [facility] LMP:No LMP recorded. EDC Estimated Date of Delivery: 09/26/23 Gestational Age at Delivery: [redacted]w[redacted]d   Admitting diagnosis: Previous cesarean section [Z98.891] Previous cesarean delivery affecting pregnancy [O34.219] Intrauterine pregnancy: [redacted]w[redacted]d     Secondary diagnosis:   Principal Problem:   Previous cesarean section Active Problems:   Obesity affecting pregnancy   History of depression   No prenatal care in current pregnancy in third trimester   Previous cesarean delivery affecting pregnancy   Uncontrolled diabetes mellitus with hyperglycemia [facility])   Discharge Diagnosis: Term Pregnancy Delivered and Type 2 DM      Hospital course: Sceduled C/S   28 y.o. yo [id number] at [redacted]w[redacted]d was admitted to the hospital 09/13/2023 for scheduled cesarean section with the following indication:Elective Repeat.Delivery details are as follows:  Membrane Rupture Time/Date: 5:01 PM,09/13/2023  Delivery Method:C-Section, Vacuum Assisted Operative Delivery:N/A Details of operation can be found in separate operative note.  Patient had a postpartum course complicated by elevated blood sugar - needing insulin  sliding scale.  She is ambulating, tolerating a regular diet, passing flatus, and urinating well. Patient is discharged home in stable condition on  09/16/23  CBG (last 3)  Recent Labs    09/15/23 1817 09/15/23 2157 09/16/23 0902  GLUCAP 99 111* 100*           Newborn Data: Birth date:09/13/2023 Birth time:5:03 PM Gender:Female Living status:Living Apgars:9 ,9  Weight:4440 g                                               Post partum procedures: none Augmentation:: N/A Complications: LGA infant Delivery Type: repeat cesarean section, low transverse incision Anesthesia: spinal anesthesia Placenta: manual removal To Pathology: Yes   Prenatal Labs:  BT : O+ Screen neg RPR NR HIV NR Varicella: in process , Rubella ordered GBS in process  Magnesium Sulfate received: No BMZ received: No Rhophylac:was not indicated MMR: was not indicated Varivax vaccine given: was not indicated T-DaP: unknown Flu:  unknown  Transfusion:No  Physical exam  Vitals:   09/15/23 0827 09/15/23 1548 09/15/23 2201 09/16/23 0811  BP: 121/81 105/61 115/61 125/65  Pulse: 78 88 91 85  Resp: 18 19 18 17   Temp: 98.2 F (36.8 C) 98.5 F (36.9 C) 98.4 F (36.9 C) 98.9 F (37.2 C)  TempSrc: Oral Oral  Oral  SpO2: 99%  100%   Weight:      Height:       General: alert, cooperative, and no distress Lochia: appropriate Uterine Fundus: firm Perineum: intact Incision: Healing well with no significant drainage, covered with occlusive OP site dressing  DVT Evaluation: No evidence of DVT seen on physical exam.  Labs: Lab Results  Component Value Date   WBC 16.2 (H) 09/14/2023   HGB 10.6 (L) 09/14/2023   HCT 31.5 (L) 09/14/2023   MCV 83.6 09/14/2023   PLT 270 09/14/2023      Latest Ref Rng & Units 09/11/2023    7:21 PM  CMP  Glucose 70 - 99 mg/dL 777   BUN 6 -  20 mg/dL 13   Creatinine 9.55 - 1.00 mg/dL 9.26   Sodium 864 - 854 mmol/L 136   Potassium 3.5 - 5.1 mmol/L 4.1   Chloride 98 - 111 mmol/L 105   CO2 22 - 32 mmol/L 23   Calcium 8.9 - 10.3 mg/dL 8.2   Total Protein 6.5 - 8.1 g/dL 5.6   Total Bilirubin 0.0 - 1.2 mg/dL 0.6   Alkaline Phos 38 - 126 U/L 93   AST 15 - 41 U/L 15   ALT 0 - 44 U/L 14    Edinburgh Score:    09/14/2023    9:00 PM  Edinburgh Postnatal Depression Scale Screening Tool  I have been able to laugh and see the funny side of things. 0  I have looked forward with enjoyment to things. 0   I have blamed myself unnecessarily when things went wrong. 1  I have been anxious or worried for no good reason. 0  I have felt scared or panicky for no good reason. 0  Things have been getting on top of me. 0  I have been so unhappy that I have had difficulty sleeping. 0  I have felt sad or miserable. 0  I have been so unhappy that I have been crying. 1  The thought of harming myself has occurred to me. 0  Edinburgh Postnatal Depression Scale Total 2    Risk assessment for postpartum VTE and prophylactic treatment: Very high risk factors: None High risk factors: BMI 40-50 kg/m2 Moderate risk factors: Cesarean delivery   Postpartum VTE prophylaxis with LMWH not indicated  After visit meds:  Allergies as of 09/16/2023   No Known Allergies      Medication List     TAKE these medications    acetaminophen  500 MG tablet Commonly known as: TYLENOL  Take 1 tablet (500 mg total) by mouth every 6 (six) hours as needed.   Blood Glucose Monitoring Suppl Devi 1 each by Does not apply route in the morning, at noon, and at bedtime. May substitute to any manufacturer covered by patient's insurance.   BLOOD GLUCOSE TEST STRIPS Strp 1 each by In Vitro route in the morning, at noon, and at bedtime. May substitute to any manufacturer covered by patient's insurance.   ibuprofen  600 MG tablet Commonly known as: ADVIL  Take 1 tablet (600 mg total) by mouth every 6 (six) hours as needed.   Lancet Device Misc 1 each by Does not apply route in the morning, at noon, and at bedtime. May substitute to any manufacturer covered by patient's insurance.   Lancets Misc. Misc 1 each by Does not apply route in the morning, at noon, and at bedtime. May substitute to any manufacturer covered by patient's insurance.   metFORMIN  500 MG tablet Commonly known as: GLUCOPHAGE  Take 1 tablet (500 mg total) by mouth 2 (two) times daily with a meal.   multivitamin-prenatal 27-0.8 MG Tabs tablet Take 1 tablet  by mouth daily at 12 noon.   oxyCODONE  5 MG immediate release tablet Commonly known as: Oxy IR/ROXICODONE  Take 1 tablet (5 mg total) by mouth every 6 (six) hours as needed for moderate pain (pain score 4-6) or breakthrough pain.       Discharge home in stable condition Infant Feeding:  Formula and expressed milk Infant Disposition:NICU Discharge instruction: per After Visit Summary and Postpartum booklet. Activity: Advance as tolerated. Pelvic rest for 6 weeks.  Diet: carb modified diet Anticipated Birth Control:  BTL completed with C/s Postpartum Appointment:6  weeks Additional Postpartum F/U: Postpartum Depression checkup and Diabetes check in 1 week Future Appointments:No future appointments. Follow up Visit:  Follow-up Information     Aisha Heller, CNM. Schedule an appointment as soon as possible for a visit in 1 week(s).   Specialty: Obstetrics Why: Diabetes and Mood check Contact information: [address] [place] [address] [phone]                 Plan:  [name] was discharged to home in good condition. Follow-up appointment as directed.    Signed: Jenifer E Tuyet Bader 09/16/2023 9:20 AM

## 2023-09-13 NOTE — Plan of Care (Signed)
  Problem: Education: Goal: Knowledge of condition will improve Outcome: Progressing Goal: Individualized Educational Video(s) Outcome: Progressing Goal: Individualized Newborn Educational Video(s) Outcome: Progressing   Problem: Activity: Goal: Will verbalize the importance of balancing activity with adequate rest periods Outcome: Progressing Goal: Ability to tolerate increased activity will improve Outcome: Progressing   Problem: Coping: Goal: Ability to identify and utilize available resources and services will improve Outcome: Progressing   Problem: Life Cycle: Goal: Chance of risk for complications during the postpartum period will decrease Outcome: Progressing   Problem: Role Relationship: Goal: Ability to demonstrate positive interaction with newborn will improve Outcome: Progressing   Problem: Skin Integrity: Goal: Demonstration of wound healing without infection will improve Outcome: Progressing   Problem: Education: Goal: Knowledge of General Education information will improve Description: Including pain rating scale, medication(s)/side effects and non-pharmacologic comfort measures Outcome: Progressing   Problem: Health Behavior/Discharge Planning: Goal: Ability to manage health-related needs will improve Outcome: Progressing   Problem: Clinical Measurements: Goal: Ability to maintain clinical measurements within normal limits will improve Outcome: Progressing Goal: Will remain free from infection Outcome: Progressing Goal: Diagnostic test results will improve Outcome: Progressing Goal: Respiratory complications will improve Outcome: Progressing Goal: Cardiovascular complication will be avoided Outcome: Progressing   Problem: Activity: Goal: Risk for activity intolerance will decrease Outcome: Progressing   Problem: Nutrition: Goal: Adequate nutrition will be maintained Outcome: Progressing   Problem: Coping: Goal: Level of anxiety will  decrease Outcome: Progressing   Problem: Elimination: Goal: Will not experience complications related to bowel motility Outcome: Progressing Goal: Will not experience complications related to urinary retention Outcome: Progressing   Problem: Pain Managment: Goal: General experience of comfort will improve and/or be controlled Outcome: Progressing   Problem: Safety: Goal: Ability to remain free from injury will improve Outcome: Progressing   Problem: Skin Integrity: Goal: Risk for impaired skin integrity will decrease Outcome: Progressing

## 2023-09-13 NOTE — Op Note (Signed)
 NAME: [name], Shirley M. MEDICAL RECORD NO: [id number] ACCOUNT NO: [id number] DATE OF BIRTH: 12-25-1995 FACILITY: [facility] LOCATION: [facility] PHYSICIAN: Debby DOROTHA Dinsmore, MD  Operative Report   DATE OF PROCEDURE: 09/13/2023  PREOPERATIVE DIAGNOSES: 1.  38+1 weeks estimated gestational age. 2.  Uncontrolled diabetes mellitus. 3.  Poor prenatal care. 4.  Elective sterilization.  POSTOPERATIVE DIAGNOSES: 1.  38+1 weeks estimated gestational age. 2.  Uncontrolled diabetes mellitus. 3.  Poor prenatal care. 4.  Elective sterilization. 5.  Large for gestational age female, delivered.  PROCEDURE: 1.  Repeat low transverse cesarean section. 2.  Bilateral tubal ligation, [place].  ANESTHESIA:  Spinal.  SURGEON:  Debby DOROTHA Dinsmore, MD  FIRST ASSISTANT:  Therisa Pillow, certified nurse midwife.  SECOND ASSISTANT:  PA student, Caryl.  INDICATIONS:  A 28 year old gravida 2, para 1 patient with no prenatal care before 1 week ago who presented to Labor and Delivery with decreased fetal movement 3 days before the procedure.  The patient was noted to have an elevated A1c and an elevated  glucose and was diagnosed with uncontrolled diabetes.  Given the higher risk for stillbirth, it was decided that the patient should be delivered at 38+1 weeks estimated gestational age.  The patient has elected for permanent sterilization at the time of  the procedure.  The patient reconfirms this desire the day of the procedure.  DESCRIPTION OF PROCEDURE:  After adequate spinal anesthesia, the patient was placed in the dorsal supine position.  Abdomen and vagina were prepped and draped in normal sterile fashion.  Traxi pannus elevator was used to elevate the pannus cephalad.   Timeout was performed.  The patient did receive 3 grams of IV Ancef  prior to commencement of the case for surgical prophylaxis.  Pfannenstiel incision was made two fingerbreadths above the synthesis pubis.  Sharp dissection was used  to identify the  fascia.  The fascia was scored in the midline and opened in a transverse fashion.  The superior aspect of the fascia was grasped with Kocher clamps and the recti muscles were dissected free.  Inferior aspect of the fascia was grasped with Kocher clamps  and the pyramidalis muscle was dissected free.  Entry into the peritoneal cavity was accomplished bluntly.  Vesicular uterine peritoneal fold was identified and was attenuated, therefore, a direct low transverse uterine incision was made.  Upon entry  into the endometrial cavity, copious clear fluid resulted and it was removed.  The incision was extended with a blunt transverse traction.  Extremely large fetal head was brought to the incision.  Kiwi Bell vacuum was applied to the flexion point of the  occiput of the fetus and the head was delivered and the vacuum was removed.  Large wide shoulders were then brought to the incision and with fundal pressure the shoulders were brought to the incision aided by delivering the posterior arm followed by the  anterior arm.  An extremely large edematous female was then dried on the patient's abdomen for 60 seconds.  The cord was clamped and passed to the nursery staff who assigned Apgar scores of 9 and 9.  Fetal weight 9 pounds 13 ounces.  Cord blood was  taken.  Placenta was manually delivered and the uterus was exteriorized.  The endometrial cavity was wiped cleaned with laparotomy tape and the uterine incision was closed with 1 chromic suture in a running locking fashion.  Two additional  figure-of-eight sutures required.  Attention was directed to the patient's right fallopian tube that was grasped in  the mid portion of the fallopian tube and 2 separate 0 plain gut sutures were applied and a 1.5 cm portion of the fallopian tube was  removed.  The left fallopian tube was identified and grasped in the mid portion and also 2 separate 0 plain gut sutures were placed and a 1.5 cm portion of the  fallopian tube was removed.  Posterior cul-de-sac was irrigated and suctioned and the uterus  was placed back into the abdominal cavity.  Pericolic gutters were wiped cleaned with laparotomy tape and the tubal ligation sites appeared hemostatic bilaterally.  The uterine incision appeared hemostatic.  Intercede was placed over the uterine incision  in T-shape fashion.  The fascia was then closed with 0 Vicryl suture in a running non-locking fashion, two separate sutures were utilized.  Fascial edges were injected with a solution of 0.25% Marcaine  60 mL plus 20 mL normal saline and approximately 30  mL solution was injected.  The subcutaneous tissues were irrigated and Bovie for hemostasis and given the depth of the subcutaneous tissues of 4 cm, the dead space was closed with a running 2-0 chromic suture.  The skin was reapproximated with Insorb  absorbable staples.  An additional 30 mL of Marcaine  solution was injected beneath the skin.  Certified nurse midwife, Mackie's assistance was needed for the procedure for retraction, fundal pressure, and visualization for the procedure.  QUANTIFIED BLOOD LOSS:  803 mL  INTRAOPERATIVE FLUIDS:  1200 mL  URINE OUTPUT:  75 mL  The patient did receive 30 mg of intravenous Toradol  at the end of the procedure and she was taken to the recovery room in good condition.   VAI D: 09/13/2023 6:28:24 pm T: 09/13/2023 8:52:00 pm  JOB: [id number]/ [id number]

## 2023-09-13 NOTE — H&P (Signed)
 [name] is a 28 y.o. female here for Schedule surgery 09/12/22 . 38+0 weeks poor prenatal care / late PN care . Dating on 09/05/23 u/s [facility] 09/26/23 Poorly controlled DM  elevated A1C( 8.9%). Seen on L+D yesterday for decreased fetal movt- reassuring fetal monitoring  Prior c/s  and desires repeat and BTL   Good movt today  Past Medical History:  has no past medical history on file.  Past Surgical History:  has a past surgical history that includes Cesarean section (04/26/2016). Family History: family history is not on file. Social History:  reports that she has never smoked. She has never used smokeless tobacco. She reports that she does not drink alcohol and does not use drugs. OB/GYN History:  OB History       Gravida  2   Para  1   Term  0   Preterm  1   AB      Living           SAB      IAB      Ectopic      Molar      Multiple      Live Births  1             Allergies: has No Known Allergies. Medications:  Current Medications    Current Outpatient Medications:    prenatal vit-iron fum-folic ac (PRENAVITE) tablet, Take 1 tablet by mouth once daily, Disp: , Rfl:      Review of Systems: General:                      No fatigue or weight loss Eyes:                           No vision changes Ears:                            No hearing difficulty Respiratory:                No cough or shortness of breath Pulmonary:                  No asthma or shortness of breath Cardiovascular:           No chest pain, palpitations, dyspnea on exertion Gastrointestinal:          No abdominal bloating, chronic diarrhea, constipations, masses, pain or hematochezia Genitourinary:             No hematuria, dysuria, abnormal vaginal discharge, pelvic pain, Menometrorrhagia Lymphatic:                   No swollen lymph nodes Musculoskeletal:No muscle weakness Neurologic:                  No extremity weakness, syncope, seizure disorder Psychiatric:                  No  history of depression, delusions or suicidal/homicidal ideation      Exam:       Vitals:    09/12/23 1611  BP: 137/86  Pulse: 90      Body mass index is 48.58 kg/m.   WDWN white/  female in NAD   Lungs: CTA  CV : RRR without murmur   Neck:  no thyromegaly Abdomen: soft , no mass, normal active bowel sounds,  non-tender, no rebound tenderness Pelvic: deferred FHT : 145   Impression:    The primary encounter diagnosis was Late prenatal care affecting pregnancy in third trimester ([facility]HS-HCC). A diagnosis of Gestational diabetes mellitus (GDM) affecting pregnancy (HHS-HCC) was also pertinent to this visit.       Plan:  Given her poor prenatal care and uncontrolled DM delivery should be performed >= 37 weeks  I recommend repeat LTCS and elective BTL tomorrow . Consents have been signed . Benefits and risks to surgery: The proposed benefit of the surgery has been discussed with the patient. The possible risks include, but are not limited to: organ injury to the bowel , bladder, ureters, and major blood vessels and nerves. There is a possibility of additional surgeries resulting from these injuries. There is also the risk of blood transfusion and the need to receive blood products during or after the procedure which may rarely lead to HIV or Hepatitis C infection. There is a risk of developing a deep venous thrombosis or a pulmonary embolism . There is the possibility of wound infection and also anesthetic complications, even the rare possibility of death.risks of fetal injury . The patient understands these risks and wishes to proceed. All questions have been answered and the consent has been signed.          No follow-ups on file.   DEBBY CLARYCE DINSMORE, MD

## 2023-09-13 NOTE — Anesthesia Procedure Notes (Signed)
 Spinal  Patient location during procedure: OR End time: 09/13/2023 4:41 PM Reason for block: surgical anesthesia Staffing Performed: resident/CRNA  Anesthesiologist: Boone Fess, MD Resident/CRNA: Anice Melnick, CRNA Performed by: Anice Melnick, CRNA Authorized by: Boone Fess, MD   Preanesthetic Checklist Completed: patient identified, IV checked, site marked, risks and benefits discussed, surgical consent, monitors and equipment checked, pre-op evaluation and timeout performed Spinal Block Patient position: sitting Prep: Betadine Patient monitoring: heart rate, continuous pulse ox, blood pressure and cardiac monitor Approach: midline Location: L4-5 Injection technique: single-shot Needle Needle type: Whitacre and Introducer  Needle gauge: 24 G Needle length: 9 cm Assessment Sensory level: T4 Events: CSF return Additional Notes Negative paresthesia. Negative blood return. Positive free-flowing CSF. Expiration date of kit checked and confirmed. Patient tolerated procedure well, without complications.

## 2023-09-13 NOTE — Anesthesia Preprocedure Evaluation (Signed)
 Anesthesia Evaluation  Patient identified by MRN, date of birth, ID band Patient awake  General Assessment Comment:  Scheduled secondary c/s. Patient found out she was pregnant only last month, no prenatal care before that, did not know she had gestational diabetes.  Reviewed: Allergy & Precautions, NPO status , Patient's Chart, lab work & pertinent test results  History of Anesthesia Complications Negative for: history of anesthetic complications  Airway Mallampati: II  TM Distance: >3 FB Neck ROM: Full    Dental no notable dental hx. (+) Teeth Intact   Pulmonary sleep apnea , neg COPD, Patient abstained from smoking.Not current smoker, former smoker vapes   Pulmonary exam normal breath sounds clear to auscultation       Cardiovascular Exercise Tolerance: Good METS(-) hypertension(-) CAD and (-) Past MI negative cardio ROS (-) dysrhythmias  Rhythm:Regular Rate:Normal - Systolic murmurs    Neuro/Psych  PSYCHIATRIC DISORDERS Anxiety Depression    negative neurological ROS     GI/Hepatic ,neg GERD  ,,(+)     (-) substance abuse    Endo/Other  diabetes, Poorly Controlled, Gestational  Class 3 obesity  Renal/GU negative Renal ROS     Musculoskeletal   Abdominal  (+) + obese  Peds  Hematology   Anesthesia Other Findings Past Medical History: No date: Anxiety No date: Depression No date: Gestational diabetes 2017: Gestational diabetes mellitus (GDM) affecting pregnancy,  antepartum No date: Sleep apnea  Reproductive/Obstetrics (+) Pregnancy                             Anesthesia Physical Anesthesia Plan  ASA: 3  Anesthesia Plan: Spinal   Post-op Pain Management: Tylenol  PO (pre-op)*, Gabapentin  PO (pre-op)* and Toradol  IV (intra-op)*   Induction:   PONV Risk Score and Plan: 4 or greater and Ondansetron  and Dexamethasone   Airway Management Planned: Natural Airway  Additional  Equipment:   Intra-op Plan:   Post-operative Plan:   Informed Consent: I have reviewed the patients History and Physical, chart, labs and discussed the procedure including the risks, benefits and alternatives for the proposed anesthesia with the patient or authorized representative who has indicated his/her understanding and acceptance.       Plan Discussed with: CRNA and Surgeon  Anesthesia Plan Comments: (Discussed R/B/A of neuraxial anesthesia technique with patient: - rare risks of spinal/epidural hematoma, nerve damage, infection - Risk of PDPH - Risk of itching - Risk of nausea and vomiting - Risk of conversion to general anesthesia and its associated risks, including sore throat, damage to lips/teeth/oropharynx, and rare risks such as cardiac and respiratory events. - Risk of surgical bleeding requiring blood products - Risk of allergic reactions Patient informed about increased incidence of above perioperative risk due to high BMI. Patient understands.   Discussed the role of CRNA in patient's perioperative care.  Patient voiced understanding.)       Anesthesia Quick Evaluation

## 2023-09-13 NOTE — Progress Notes (Signed)
 Pt ready for repeat c/s . Glucose 91 . Reconfirms desire for BTL .  Proceed

## 2023-09-14 LAB — VARICELLA ZOSTER ANTIBODY, IGG: Varicella IgG: REACTIVE

## 2023-09-14 LAB — CBC
HCT: 31.5 % — ABNORMAL LOW (ref 36.0–46.0)
Hemoglobin: 10.6 g/dL — ABNORMAL LOW (ref 12.0–15.0)
MCH: 28.1 pg (ref 26.0–34.0)
MCHC: 33.7 g/dL (ref 30.0–36.0)
MCV: 83.6 fL (ref 80.0–100.0)
Platelets: 270 10*3/uL (ref 150–400)
RBC: 3.77 MIL/uL — ABNORMAL LOW (ref 3.87–5.11)
RDW: 12.6 % (ref 11.5–15.5)
WBC: 16.2 10*3/uL — ABNORMAL HIGH (ref 4.0–10.5)
nRBC: 0 % (ref 0.0–0.2)

## 2023-09-14 LAB — CULTURE, BETA STREP (GROUP B ONLY)

## 2023-09-14 LAB — GLUCOSE, CAPILLARY
Glucose-Capillary: 131 mg/dL — ABNORMAL HIGH (ref 70–99)
Glucose-Capillary: 171 mg/dL — ABNORMAL HIGH (ref 70–99)
Glucose-Capillary: 115 mg/dL — ABNORMAL HIGH (ref 70–99)

## 2023-09-14 MED ORDER — INSULIN ASPART 100 UNIT/ML IJ SOLN: 0.0000 [IU] | Freq: Three times a day (TID) | INTRAMUSCULAR | Status: DC

## 2023-09-14 MED ORDER — INSULIN ASPART 100 UNIT/ML IJ SOLN
0.0000 [IU] | Freq: Three times a day (TID) | INTRAMUSCULAR | Status: DC
  Administered 2023-09-14: 4 [IU] via SUBCUTANEOUS
  Administered 2023-09-15: 3 [IU] via SUBCUTANEOUS
  Filled 2023-09-14 (×2): qty 1

## 2023-09-14 MED ORDER — INSULIN ASPART 100 UNIT/ML IJ SOLN: 0.0000 [IU] | Freq: Every day | INTRAMUSCULAR | Status: DC

## 2023-09-14 NOTE — Inpatient Diabetes Management (Addendum)
 Inpatient Diabetes Program Recommendations  AACE/ADA: New Consensus Statement on Inpatient Glycemic Control (2015)  Target Ranges:  Prepandial:   less than 140 mg/dL      Peak postprandial:   less than 180 mg/dL (1-2 hours)      Critically ill patients:  140 - 180 mg/dL   Lab Results  Component Value Date   GLUCAP 131 (H) 09/14/2023   HGBA1C 8.9 (H) 09/11/2023    Review of Glycemic Control  Latest Reference Range & Units 09/14/23 07:59  Glucose-Capillary 70 - 99 mg/dL 868 (H)  (H): Data is abnormally high Diabetes history: GDM? Vs T2dM? Outpatient Diabetes medications: none Current orders for Inpatient glycemic control: none Decadron  10 mg x 1  Inpatient Diabetes Program Recommendations:    Consider adding Novolog  0-9 units TID & HS Noted hyperglycemia following steroids. Will follow for discharge recommendations.  Will need close PCP follow up.   Addendum: Secure chat sent to CNM with recommendations. Discussed with RN.  Spoke with patient regarding diabetes and glucose trends while inpatient. Patient reports having GDM in prior pregnancies, however just discovered she was pregnant two weeks ago. Reviewed patient's current A1c of 8.3% and reviewed accuracy in pregnancy. Explained what a A1c is and what it measures. Also reviewed goal A1c with patient, importance of good glucose control @ home, and blood sugar goals.  Reviewed signs and symptoms of hyperglycemia, risk for infection with poor glycemic control, survival skills, interventions, recommended frequency for checking CBGs, importance of follow up with PCP, when to call MD, vascular changes and commorbidities. Patient will need a meter at discharge. Patient plans to make PCP appointment.  Admits to drinking sugary beverages, however, open to making changes to alternatives. No additional questions at this time.   Thanks, Tinnie Minus, MSN, RNC-OB Diabetes Coordinator [phone] (8a-5p)

## 2023-09-14 NOTE — Anesthesia Postprocedure Evaluation (Signed)
 Anesthesia Post Note  Patient: [name]  Procedure(s) Performed: REPEAT CESAREAN SECTION WITH BILATERAL TUBAL LIGATION (Bilateral)  Patient location during evaluation: Mother Baby Anesthesia Type: Spinal Level of consciousness: oriented and awake and alert Pain management: pain level controlled Vital Signs Assessment: post-procedure vital signs reviewed and stable Respiratory status: spontaneous breathing and respiratory function stable Cardiovascular status: blood pressure returned to baseline and stable Postop Assessment: no headache, no backache, no apparent nausea or vomiting and able to ambulate Anesthetic complications: no   No notable events documented.   Last Vitals:  Vitals:   09/14/23 0803 09/14/23 0900  BP: (!) 95/42 98/66  Pulse:    Resp: 18   Temp: 36.8 C   SpO2:      Last Pain:  Vitals:   09/14/23 0803  TempSrc: Oral  PainSc:                  Prentice Murphy

## 2023-09-14 NOTE — Anesthesia Post-op Follow-up Note (Signed)
  Anesthesia Pain Follow-up Note  Patient: [name]  Day #: 1  Date of Follow-up: 09/14/2023 Time: 11:11 AM  Last Vitals:  Vitals:   09/14/23 0803 09/14/23 0900  BP: (!) 95/42 98/66  Pulse:    Resp: 18   Temp: 36.8 C   SpO2:      Level of Consciousness: alert  Pain: mild   Side Effects:Pruritis  Catheter Site Exam:clean, dry  Anti-Coag Meds (From admission, onward)    Start     Dose/Rate Route Frequency Ordered Stop   09/14/23 1700  enoxaparin  (LOVENOX ) injection 40 mg        40 mg Subcutaneous Every 24 hours 09/13/23 2111          Plan: D/C from anesthesia care at surgeon's request  Prentice Murphy

## 2023-09-14 NOTE — Progress Notes (Signed)
 Postop Day  1  Subjective: 28 y.o. H7E8897 postpartum day #1 status post repeat cesarean section and tubal ligation was performed. She is ambulating, is tolerating po, is voiding spontaneously.  Her pain is well controlled on PO pain medications. Her lochia is less than menses.  Objective: BP 98/66 (BP Location: Right Arm)   Pulse 78   Temp 98.3 F (36.8 C) (Oral)   Resp 18   Ht 5' 4 (1.626 m)   Wt 128.4 kg   SpO2 98%   Breastfeeding Yes   BMI 48.58 kg/m    Physical Exam:  General: alert, cooperative, and appears stated age Breasts: soft/nontender Pulm: nl effort Abdomen: soft, non-tender, active bowel sounds Uterine Fundus: firm Incision: healing well, no significant drainage, no dehiscence, no significant erythema Perineum: minimal edema,  Lochia: appropriate DVT Evaluation: No evidence of DVT seen on physical exam. Negative Homan's sign. No cords or calf tenderness. No significant calf/ankle edema.  Recent Labs    09/11/23 1921 09/14/23 0521  HGB 11.9* 10.6*  HCT 35.2* 31.5*  WBC 9.1 16.2*  PLT 289 270    Assessment/Plan: 27 y.o. G2P1102 Postop Day  1  1. Continue routine postpartum care  2. Infant feeding status: formula feeding and expressed breast milk --Lactation consult PRN for breastfeeding   3. Contraception plan: bilateral tubal ligation  4. Acute blood loss anemia - clinically significant.  --Hemodynamically stable and asymptomatic --Intervention: no intervention   5. Immunization status:   all immunizations up to date  6. Elevated A1C, elevated fasting BG 131 this AM -Diabetic coordinator consulted -recommended Novolog  SS insulin  0-9 units TID and HS with meals and a glucometer at discharge  Disposition: continue inpatient postpartum care    LOS: 1 day   Keianna Signer, CNM 09/14/2023, 3:36 PM   ----- Bobbette Brunswick Certified Nurse Midwife [facility]

## 2023-09-14 NOTE — Lactation Note (Signed)
 This note was copied from a baby's chart. Lactation Consultation Note  Patient Name: [name] Date: 09/14/2023 Age:29 hours Reason for consult: Follow-up assessment;NICU baby;Early term 37-38.6wks   Maternal Data    Feeding Mother's Current Feeding Choice: Breast Milk and Formula Mom assisted with pumping LATCH Score                    Lactation Tools Discussed/Used Tools: Pump Breast pump type: Double-Electric Breast Pump Reason for Pumping: baby in SCN Pumping frequency: instructed to pump q3 hr or 8x/24 hrs Pumped volume:  (drops, collected in syringe and taken to SCN)  Interventions  Encouraged to pump breasts q 3hr or 8x/24 hr  Discharge Pump: DEBP;Personal  Consult Status Consult Status: PRN Date: 09/14/23 Follow-up type: In-patient    [name] 09/14/2023, 3:00 PM

## 2023-09-14 NOTE — Lactation Note (Addendum)
 This note was copied from a baby's chart. Lactation Consultation Note  Patient Name: Girl Greta Yung Unijb'd Date: 09/14/2023 Age:29 hours Reason for consult: Initial assessment;NICU baby;Early term 37-38.6wks;Other (Comment);Breastfeeding assistance (GDM, late pnc)   Maternal Data Does the patient have breastfeeding experience prior to this delivery?: Yes How long did the patient breastfeed?: 2.5 yrs (now 28 yr old)  Feeding Mother's Current Feeding Choice: Breast Milk and Formula Nipple Type: Slow - flow Mom breastfed x 1 post delivery and then baby was transferred to [facility],  has not pumped yet, DEBP symphony set up and mom pumped in initiation mode.  Obtained approx 0.1 or more colostrum in colostrum collector, this was labeled and taken to SCN.  Mom instructed in use and set up of pump.  She hopes to go to SCN today and breastfeed baby    LATCH Score                    Lactation Tools Discussed/Used Tools: Pump Breast pump type: Double-Electric Breast Pump Pump Education: Setup, frequency, and cleaning;Milk Storage Reason for Pumping: baby in SCN, hypoglycemia Pumping frequency: instructed to pump 8x/24 hr or q3hr Pumped volume:  (approx 0.1 cc taken to SCN in colostrum collector)  Interventions Interventions: DEBP;Education LC name and no written on white board Discharge Pump: Personal;DEBP WIC Program: No (plans on applying after discharge) [facility] referral faxed to [place] WIC Consult Status Consult Status: Follow-up Date: 09/14/23 Follow-up type: In-patient    Aldona JONETTA Converse 09/14/2023, 10:41 AM

## 2023-09-15 LAB — GLUCOSE, CAPILLARY
Glucose-Capillary: 90 mg/dL (ref 70–99)
Glucose-Capillary: 121 mg/dL — ABNORMAL HIGH (ref 70–99)
Glucose-Capillary: 99 mg/dL (ref 70–99)
Glucose-Capillary: 111 mg/dL — ABNORMAL HIGH (ref 70–99)

## 2023-09-15 NOTE — Clinical Social Work Maternal (Addendum)
  CLINICAL SOCIAL WORK MATERNAL/CHILD NOTE  Patient Details  Name: [name] MRN: [id number] Date of Birth: 10/12/1995  Date:  09/15/2023  Clinical Social Worker Initiating Note:  Lenn Mean, LCSW Date/Time: Initiated:  09/15/23/1435     Child's Name:  [name]   Biological Parents:  Mother   Need for Interpreter:  None   Reason for Referral:  Other (Comment) (Minimal Pre-natal care)   Address:  [address] [place] [address]    Phone number:  [phone] (home)     Additional phone number:   Household Members/Support Persons (HM/SP):   Household Member/Support Person 1   HM/SP Name Relationship DOB or Age  HM/SP -1 Vina Peel Mother    HM/SP -2        HM/SP -3        HM/SP -4        HM/SP -5        HM/SP -6        HM/SP -7        HM/SP -8          Natural Supports (not living in the home):  Parent   Professional Supports:     Employment: Environmental Education Officer   Type of Work: Runner, Broadcasting/film/video to go   Education:      Homebound arranged:    Surveyor, Quantity Resources:  Medicaid   Other Resources:  [employer] (Patient will apply for [facility])   Cultural/Religious Considerations Which May Impact Care:    Strengths:  Ability to meet basic needs  , Home prepared for child     Psychotropic Medications:         Pediatrician:       Pediatrician List:   Radiographer, Therapeutic    [place]    [facility]      Pediatrician Fax Number:    Risk Factors/Current Problems:  None   Cognitive State:  Insightful     Mood/Affect:  Tearful  , Interested   (Patient will be discharging tomorrow, newborn will need to remain hospitalized for additional care-patient expressed sadness regarding this)   CSW Assessment: CSW met with patient and patient's mother at bedside. Patient states that she resides in a supportive environment with her mother, sister and 9 year old son. Patient is currently on maternity leave from [employer] to  Go. Patient states that she has all items needed to care for her newborn with the support of family and friends. Patient plans to follow up with her OBGYN Dr. Lovetta post discharge from the hospital. Patient's newborn to follow up with Dr. Lorel with  [facility]. Patient also plans to apply for [facility]. Patient reports having no additional needs at this time.  5:05pm Nisource provided for future use if needed.  CSW Plan/Description:  No Further Intervention Required/No Barriers to Discharge    Mean Lenn [place], [place] 09/15/2023, 2:47 PM

## 2023-09-15 NOTE — Progress Notes (Signed)
 Postop Day  2  Subjective: 28 y.o. H7E8897 postpartum day #2 status post repeat cesarean section and tubal ligation was performed. She is ambulating, is tolerating po, is voiding spontaneously.  Her pain is well controlled on PO pain medications. Her lochia is less than menses.  Objective: BP 121/81 (BP Location: Left Arm)   Pulse 78   Temp 98.2 F (36.8 C) (Oral)   Resp 18   Ht 5' 4 (1.626 m)   Wt 128.4 kg   SpO2 99%   Breastfeeding Yes   BMI 48.58 kg/m    Physical Exam:  General: alert, cooperative, and appears stated age Breasts: soft/nontender Pulm: nl effort Abdomen: soft, non-tender, active bowel sounds Uterine Fundus: firm Incision: healing well, no significant drainage, no dehiscence, no significant erythema Perineum: minimal edema,  Lochia: appropriate DVT Evaluation: No evidence of DVT seen on physical exam. Negative Homan's sign. No cords or calf tenderness. No significant calf/ankle edema.  Recent Labs    09/14/23 0521  HGB 10.6*  HCT 31.5*  WBC 16.2*  PLT 270    Assessment/Plan: 27 y.o. G2P1102 Postop Day  2  1. Continue routine postpartum care  2. Infant feeding status: formula feeding and expressed breast milk --Lactation consult PRN for breastfeeding    3. Contraception plan: bilateral tubal ligation, completed  4. Acute blood loss anemia - clinically significant.  --Hemodynamically stable and asymptomatic --Intervention: no intervention   5. Immunization status:   all immunizations up to date  6. Continue SS Novolog  TID and HS per diabetic coordinator  Disposition: continue inpatient postpartum care , plan for discharge home tomorrow    LOS: 2 days   Anniece Bleiler, CNM 09/15/2023, 9:24 AM   ----- Bobbette Brunswick Certified Nurse Midwife [facility]

## 2023-09-16 DIAGNOSIS — E1165 Type 2 diabetes mellitus with hyperglycemia: Secondary | ICD-10-CM | POA: Diagnosis present

## 2023-09-16 LAB — GLUCOSE, CAPILLARY
Glucose-Capillary: 98 mg/dL (ref 70–99)
Glucose-Capillary: 100 mg/dL — ABNORMAL HIGH (ref 70–99)

## 2023-09-16 MED ORDER — OXYCODONE HCL 5 MG PO TABS: 5.0000 mg | ORAL_TABLET | Freq: Four times a day (QID) | ORAL | 0 refills | Status: DC | PRN

## 2023-09-16 MED ORDER — BLOOD GLUCOSE MONITORING SUPPL DEVI: 1.0000 | Freq: Three times a day (TID) | 0 refills | Status: AC

## 2023-09-16 MED ORDER — LANCETS MISC. MISC: 1.0000 | Freq: Three times a day (TID) | 0 refills | Status: AC

## 2023-09-16 MED ORDER — IBUPROFEN 600 MG PO TABS: 600.0000 mg | ORAL_TABLET | Freq: Four times a day (QID) | ORAL | Status: AC | PRN

## 2023-09-16 MED ORDER — BLOOD GLUCOSE TEST VI STRP: 1.0000 | ORAL_STRIP | Freq: Three times a day (TID) | 0 refills | Status: AC

## 2023-09-16 MED ORDER — LANCET DEVICE MISC: 1.0000 | Freq: Three times a day (TID) | 0 refills | Status: AC

## 2023-09-16 MED ORDER — ACETAMINOPHEN 500 MG PO TABS: 500.0000 mg | ORAL_TABLET | Freq: Four times a day (QID) | ORAL | Status: AC | PRN

## 2023-09-16 MED ORDER — METFORMIN HCL 500 MG PO TABS
500.0000 mg | ORAL_TABLET | Freq: Two times a day (BID) | ORAL | 11 refills | Status: AC
Start: 2023-09-16 — End: 2024-09-15

## 2023-09-16 NOTE — Discharge Instructions (Signed)
Discharge Instructions:   Follow-up Appointment:   If there are any new medications, they have been ordered and will be available for pickup at the listed pharmacy on your way home from the hospital.   Call office if you have any of the following: headache, visual changes, fever >101.0 F, chills, shortness of breath, breast concerns, excessive vaginal bleeding, incision drainage or problems, leg pain or redness, depression or any other concerns. If you have vaginal discharge with an odor, let your doctor know.   It is normal to bleed for up to 6 weeks. You should not soak through more than 1 pad in 1 hour. If you have a blood clot larger than your fist with continued bleeding, call your doctor.   After a c-section, you should expect a small amount of blood or clear fluid coming from the incision and abdominal cramping/soreness. Inspect your incision site daily. Stand in front of a mirror to look for any redness, incision opening, or discolored/odorness drainage. Take a shower daily and continue good hygiene. Use own towel and washcloth (do not share). Make sure your sheets on your bed are clean. No pets sleeping around your incision site. Dressing will be removed at your postpartum visit. If the dressing does become wet or soiled underneath, it is okay to remove it.   On-Q pump: You will remove on day 5 after insertion or if the ball becomes flat before day 5. You will remove on: Dec 07, 2018  Activity: Do not lift > 10 lbs for 6 weeks (do not lift anything heavier than your baby). No intercourse, tampons, swimming pools, hot tubs, baths (only showers) for 6 weeks.  No driving for 1-2 weeks. Continue prenatal vitamin, especially if breastfeeding. Increase calories and fluids (water) while breastfeeding.   Your milk will come in, in the next couple of days (right now it is colostrum). You may have a slight fever when your milk comes in, but it should go away on its own.  If it does not, and rises  above 101 F please call the doctor. You will also feel achy and your breasts will be firm. They will also start to leak. If you are breastfeeding, continue as you have been and you can pump/express milk for comfort.   If you have too much milk, your breasts can become engorged, which could lead to mastitis. This is an infection of the milk ducts. It can be very painful and you will need to notify your doctor to obtain a prescription for antibiotics. You can also treat it with a shower or hot/cold compress.   For concerns about your baby, please call your pediatrician.  For breastfeeding concerns, the lactation consultant can be reached at [phone].   Postpartum blues (feelings of happy one minute and sad another minute) are normal for the first few weeks but if it gets worse let your doctor know.   Congratulations! We enjoyed caring for you and your new bundle of joy!

## 2023-09-16 NOTE — Inpatient Diabetes Management (Signed)
 Inpatient Diabetes Program Recommendations  AACE/ADA: New Consensus Statement on Inpatient Glycemic Control  Target Ranges:  Prepandial:   less than 140 mg/dL      Peak postprandial:   less than 180 mg/dL (1-2 hours)      Critically ill patients:  140 - 180 mg/dL    Latest Reference Range & Units 09/15/23 08:36 09/15/23 12:59 09/15/23 18:17 09/15/23 21:57  Glucose-Capillary 70 - 99 mg/dL 90 782 (H) 99 956 (H)    Latest Reference Range & Units 09/14/23 07:59 09/14/23 17:43 09/14/23 21:29  Glucose-Capillary 70 - 99 mg/dL 213 (H) 086 (H) 578 (H)    Latest Reference Range & Units 09/11/23 19:21  Hemoglobin A1C 4.8 - 5.6 % 8.9 (H)   Review of Glycemic Control  Diabetes history: GDM vs DM2? Outpatient Diabetes medications: None Current orders for Inpatient glycemic control: Novolog  0-9 units TID with meals  NOTE: Received page from [place] regarding patient and discharge recommendations. Per chart, Barbra Ley, RN, Inpatient diabetes coordinator spoke with patient on 09/14/23 and patient had prior GDM hx; patient had just discovered she was pregnant [redacted] weeks ago. There is question as to whether patient had GDM versus undx DM2. Given glucose trends and hx, would recommend to discharge patient on Metformin  500 mg BID,have patient check CBGs 2-3 times per day, and follow up with primary care regarding glycemic control. Patient will need Rx for glucose monitoring kit (#[id number]) and Metformin  at discharge.   Discharge Recommendations: Supply/Referral recommendations: Glucometer Test strips Lancet device Lancets   Use Adult Diabetes Insulin  Treatment Post Discharge order set.  Thanks, Beacher Limerick, RN, MSN, CDCES Diabetes Coordinator Inpatient Diabetes Program [phone] (Team Pager from 8am to 5pm)

## 2023-09-16 NOTE — Progress Notes (Signed)
 Patient discharged home. Discharge instructions and prescriptions given and reviewed with patient. Patient verbalized understanding. Escorted out by Agricultural consultant.

## 2023-09-17 ENCOUNTER — Encounter: Payer: Self-pay | Admitting: Obstetrics and Gynecology

## 2023-09-17 LAB — SURGICAL PATHOLOGY

## 2023-09-19 ENCOUNTER — Ambulatory Visit: Payer: Self-pay

## 2023-09-19 NOTE — Lactation Note (Signed)
This note was copied from a baby's chart. Lactation Consultation Note  Patient Name: [name] [id number] Date: 09/19/2023 Age:29 days Reason for consult: Follow-up assessment;Early term 37-38.6wks;RN request;Breastfeeding assistance   Maternal Data Lactation called to bedside to attempt to assist infant with a feeding at the breast.  MOB stated to RN and SLP that she was interested in breastfeeding.   During visit, MOB informed LC that she nursed her first for 68yrs w/o any issues.  Only thing is she didn't do good pumping and could only breastfeed.  Whereas currently she is doing really well pumping.  Feeding MOB has good positioning with infant at the breast.  She was able to attempt cradle hold and football hold.  Infant cued but did not latch.  After several attempts, MOB held infant and cuddled.  Lactation Tools Discussed/Used Tools: 79F feeding tube / Syringe  Consult Status Consult Status: PRN    Yvette Rack Mansour Balboa 09/19/2023, 2:53 PM

## 2023-10-06 ENCOUNTER — Ambulatory Visit: Payer: Self-pay

## 2023-10-06 NOTE — Lactation Note (Signed)
 This note was copied from a baby's chart. Lactation Consultation Note  Patient Name: [name] [id number] Date: 10/06/2023 Age:29 wk.o.     Maternal Data LC met w/ MOB at bedside w/ infant today for a NICU follow up.  MOB is pumping at beside w/ a Medela Manual pump.  She stated that she prefers pumping specifically with this pump vs the DEBP.  Mom states that she is not pumping at night because she is so tired.    Interventions LC reviewed the importance of pumping at least 8-12x w/in a 24hr period.  LC offered to set MOB up with a pumping schedule to help maximize milk production.  Mom stated that she would consider trying to add more pump sessions to her daily routine.    LC provided MOB w/ the Lactation Office number and encouraged her to call when she comes in on a late evening because we may have an LC here who can assist w/ a latch.  Mom verbalized understanding.    Yvette Rack Sherlynn Tourville 10/06/2023, 4:27 PM

## 2023-10-11 ENCOUNTER — Ambulatory Visit: Payer: Self-pay

## 2023-10-11 NOTE — Lactation Note (Signed)
 This note was copied from a baby's chart. Lactation Consultation Note  Patient Name: [name] Date: 10/11/2023 Age:29 wk.o. Reason for consult: Follow-up assessment;NICU baby   Maternal Data    Feeding  BAby at left breast in cradle hold when SCN bed space 03 entered, SCN nurse assisting mom with breastfeeding, baby having issues with coordinating suck at times but will then have bursts of latching and then nursing well for a few minutes with sandwiching of breast tissue. They are not  using a nipple shield at this time.      LATCH Score Latch: Repeated attempts needed to sustain latch, nipple held in mouth throughout feeding, stimulation needed to elicit sucking reflex.  Audible Swallowing: A few with stimulation  Type of Nipple: Everted at rest and after stimulation  Comfort (Breast/Nipple): Soft / non-tender  Hold (Positioning): Assistance needed to correctly position infant at breast and maintain latch.  LATCH Score: 7   Lactation Tools Discussed/Used    Interventions    Discharge    Consult Status Consult Status: PRN Date: 10/12/23 Follow-up type: In-patient    Dyann Kief 10/11/2023, 7:52 PM

## 2023-10-17 ENCOUNTER — Ambulatory Visit: Payer: Self-pay

## 2023-10-17 NOTE — Lactation Note (Signed)
 This note was copied from a baby's chart. Lactation Consultation Note  Patient Name: [name] [id number] Date: 10/17/2023 Age:29 wk.o. Reason for consult: Weekly NICU follow-up;Early term 37-38.6wks;Breastfeeding assistance   Maternal Data LC called to bedside to assist w/ a feeding, with the use of a SNS.  MOB stated that she used a SNS w/ her last child and wanted to try this with her daughter.  She also informed lactation that infant will latch for only 5 minutes and stop, therefore she wonders if infant is at the breast w/ the SNS will it encourage her to stay longer.   Feeding Nipple Type: Dr. Irving Burton Preemie  [facility] set up the SNS for the mom and baby.  Several attempts were made to get infant to latch on both breast but this was not successful.    LC stated that the next time mom returns to attempt a breastfeeding session, she recommends calling the lactation office so we can help schedule and be present for a feeding.  At the beginning of the feed we can see how infant does with the SNS. Mom verbalized understanding.   Lactation Tools Discussed/Used Tools: Supplemental Nutrition System  Education was provided to mom on how to put the SNS together and how to use it.    Interventions Interventions: Education  Consult Status Consult Status: NICU follow-up Follow-up type: Call as needed    Yvette Rack Rhyatt Muska 10/17/2023, 2:28 PM

## 2023-10-26 ENCOUNTER — Ambulatory Visit: Payer: Self-pay

## 2023-10-26 NOTE — Lactation Note (Signed)
 This note was copied from a baby's chart. Mom at bedside of baby, I inquired about her pumping status and milk supply, she states that her supply has decreased and only obtaining 1-1.5 oz EBM q 3h, baby is getting mostly formula per tube, she is also planning to return to work next week and will not be pumping at work due to time constraints.  Mom encouraged and reassured that the milk that she has provided has been very beneficial to baby and she is doing the best that she can for her baby.

## 2024-06-05 ENCOUNTER — Emergency Department

## 2024-06-05 ENCOUNTER — Encounter: Payer: Self-pay | Admitting: Emergency Medicine

## 2024-06-05 ENCOUNTER — Other Ambulatory Visit: Payer: Self-pay

## 2024-06-05 ENCOUNTER — Emergency Department
Admission: EM | Admit: 2024-06-05 | Discharge: 2024-06-05 | Disposition: A | Discharge status: Home / Self Care | Attending: Emergency Medicine | Admitting: Emergency Medicine

## 2024-06-05 DIAGNOSIS — D72829 Elevated white blood cell count, unspecified: Secondary | ICD-10-CM | POA: Insufficient documentation

## 2024-06-05 DIAGNOSIS — K8020 Calculus of gallbladder without cholecystitis without obstruction: Secondary | ICD-10-CM | POA: Diagnosis not present

## 2024-06-05 DIAGNOSIS — R1011 Right upper quadrant pain: Secondary | ICD-10-CM | POA: Diagnosis present

## 2024-06-05 LAB — URINALYSIS, ROUTINE W REFLEX MICROSCOPIC
Bilirubin Urine: NEGATIVE
Glucose, UA: NEGATIVE mg/dL
Hgb urine dipstick: NEGATIVE
Ketones, ur: NEGATIVE mg/dL
Nitrite: NEGATIVE
Protein, ur: 30 mg/dL — AB
Specific Gravity, Urine: 1.031 — ABNORMAL HIGH (ref 1.005–1.030)
pH: 5 (ref 5.0–8.0)

## 2024-06-05 LAB — COMPREHENSIVE METABOLIC PANEL WITH GFR
ALT: 18 U/L (ref 0–44)
AST: 18 U/L (ref 15–41)
Albumin: 3.9 g/dL (ref 3.5–5.0)
Alkaline Phosphatase: 51 U/L (ref 38–126)
Anion gap: 9 (ref 5–15)
BUN: 20 mg/dL (ref 6–20)
CO2: 28 mmol/L (ref 22–32)
Calcium: 9 mg/dL (ref 8.9–10.3)
Chloride: 101 mmol/L (ref 98–111)
Creatinine, Ser: 0.71 mg/dL (ref 0.44–1.00)
GFR, Estimated: >60 mL/min (ref >60)
Glucose, Bld: 132 mg/dL — ABNORMAL HIGH (ref 70–99)
Potassium: 4.1 mmol/L (ref 3.5–5.1)
Sodium: 138 mmol/L (ref 135–145)
Total Bilirubin: 0.7 mg/dL (ref 0.0–1.2)
Total Protein: 7.8 g/dL (ref 6.5–8.1)

## 2024-06-05 LAB — CBC
HCT: 40.2 % (ref 36.0–46.0)
Hemoglobin: 13.6 g/dL (ref 12.0–15.0)
MCH: 28.7 pg (ref 26.0–34.0)
MCHC: 33.8 g/dL (ref 30.0–36.0)
MCV: 84.8 fL (ref 80.0–100.0)
Platelets: 379 K/uL (ref 150–400)
RBC: 4.74 MIL/uL (ref 3.87–5.11)
RDW: 11.9 % (ref 11.5–15.5)
WBC: 11.9 K/uL — ABNORMAL HIGH (ref 4.0–10.5)
nRBC: 0 % (ref 0.0–0.2)

## 2024-06-05 LAB — LIPASE, BLOOD: Lipase: 41 U/L (ref 11–51)

## 2024-06-05 LAB — POC URINE PREG, ED: Preg Test, Ur: NEGATIVE

## 2024-06-05 MED ORDER — ONDANSETRON 4 MG PO TBDP: 4.0000 mg | ORAL_TABLET | Freq: Three times a day (TID) | ORAL | 0 refills | Status: AC | PRN

## 2024-06-05 MED ORDER — OXYCODONE-ACETAMINOPHEN 5-325 MG PO TABS: 1.0000 | ORAL_TABLET | Freq: Four times a day (QID) | ORAL | 0 refills | Status: DC | PRN

## 2024-06-05 MED ORDER — OXYCODONE-ACETAMINOPHEN 5-325 MG PO TABS
1.0000 | ORAL_TABLET | Freq: Once | ORAL | Status: AC
  Administered 2024-06-05: 1 via ORAL
  Filled 2024-06-05: qty 1

## 2024-06-05 MED ORDER — ONDANSETRON 4 MG PO TBDP
4.0000 mg | ORAL_TABLET | Freq: Once | ORAL | Status: AC
  Administered 2024-06-05: 4 mg via ORAL
  Filled 2024-06-05: qty 1

## 2024-06-05 MED ORDER — OXYCODONE-ACETAMINOPHEN 5-325 MG PO TABS
1.0000 | ORAL_TABLET | ORAL | Status: DC | PRN
  Administered 2024-06-05: 1 via ORAL
  Filled 2024-06-05: qty 1

## 2024-06-05 NOTE — ED Provider Notes (Signed)
 [facility] Provider Note    Event Date/Time   First MD Initiated Contact with Patient 06/05/24 [phone]     (approximate)   History   Abdominal Pain   HPI  [name] is a 28 y.o. female presents to the emergency department with abdominal pain.  States that at 1130 last night started having abdominal pain that was in the right upper abdomen associate with nausea and vomiting.  Prior episode in the past and states that she was diagnosed with gallstones.  Denies fever or chills.  Got pain medication on arrival to the emergency department and states she is feeling much better.  Denies any significant alcohol use or marijuana use.  No significant NSAIDs or Goody powder use.  Prior C-section and tubal ligation.  No dysuria, urinary urgency or frequency.  No fever or chills.     Physical Exam   Triage Vital Signs: ED Triage Vitals  Encounter Vitals Group     BP 06/05/24 0145 (!) 121/91     Girls Systolic BP Percentile --      Girls Diastolic BP Percentile --      Boys Systolic BP Percentile --      Boys Diastolic BP Percentile --      Pulse Rate 06/05/24 0145 84     Resp 06/05/24 0145 18     Temp 06/05/24 0144 98.1 F (36.7 C)     Temp Source 06/05/24 0144 Oral     SpO2 06/05/24 0145 98 %     Weight --      Height --      Head Circumference --      Peak Flow --      Pain Score 06/05/24 0145 10     Pain Loc --      Pain Education --      Exclude from Growth Chart --     Most recent vital signs: Vitals:   06/05/24 0716 06/05/24 0717  BP:  117/67  Pulse:  79  Resp:  16  Temp: 98.4 F (36.9 C)   SpO2:  99%    Physical Exam Constitutional:      Appearance: She is well-developed.  HENT:     Head: Atraumatic.  Eyes:     Conjunctiva/sclera: Conjunctivae normal.  Cardiovascular:     Rate and Rhythm: Regular rhythm.  Pulmonary:     Effort: No respiratory distress.  Abdominal:     General: There is no distension.     Tenderness:  There is abdominal tenderness in the right upper quadrant. There is no right CVA tenderness or left CVA tenderness. Negative signs include Murphy's sign and McBurney's sign.  Musculoskeletal:        General: Normal range of motion.     Cervical back: Normal range of motion.  Skin:    General: Skin is warm.  Neurological:     Mental Status: She is alert. Mental status is at baseline.     IMPRESSION / MDM / ASSESSMENT AND PLAN / ED COURSE  I reviewed the triage vital signs and the nursing notes.  Differential diagnosis including gastritis/PUD, cholelithiasis, acute cholecystitis, ectopic pregnancy, pyelonephritis, acute appendicitis    RADIOLOGY Distended gallbladder with 2.4 cm gallstone in the gallbladder neck.  Appears similar to prior study in 2022.  No evidence of gallbladder wall thickening, pericholecystic fluid or Murphy sign.  No evidence of biliary ductal dilation.  LABS (all labs ordered are listed, but only abnormal results are  displayed) Labs interpreted as -    Labs Reviewed  COMPREHENSIVE METABOLIC PANEL WITH GFR - Abnormal; Notable for the following components:      Result Value   Glucose, Bld 132 (*)    All other components within normal limits  CBC - Abnormal; Notable for the following components:   WBC 11.9 (*)    All other components within normal limits  URINALYSIS, ROUTINE W REFLEX MICROSCOPIC - Abnormal; Notable for the following components:   Color, Urine YELLOW (*)    APPearance HAZY (*)    Specific Gravity, Urine 1.031 (*)    Protein, ur 30 (*)    Leukocytes,Ua MODERATE (*)    Bacteria, UA RARE (*)    All other components within normal limits  LIPASE, BLOOD  POC URINE PREG, ED     MDM  Lab work overall reassuring.  Mild leukocytosis of 11.9.  Creatinine at baseline with no significant electrolyte abnormalities.  Normal LFTs and T. bili.  Normal lipase.  Urine with no signs of urinary tract infection.  Pregnancy test is negative.  Given p.o.  Percocet and ondansetron   On reevaluation states she is feeling much better, discussed possible outpatient elective cholecystectomy.  Discussed return precautions for any ongoing or worsening symptoms.  Discussed return for any signs of an infection with high fever or if she is unable to tolerate p.o.  Patient expressed understanding.  Will do a short course of pain medication with antiemetics.  Given information to follow-up as an outpatient with general surgery.  No significant lower abdominal tenderness to palpation on reevaluation.  Have a very low suspicion for acute Penta situs, torsion or other acute intra-abdominal pathology and do not feel that she needs a CT scan of her abdomen and pelvis with contrast at this time.     PROCEDURES:  Critical Care performed: No  Procedures  Patient's presentation is most consistent with acute presentation with potential threat to life or bodily function.   MEDICATIONS ORDERED IN ED: Medications  ondansetron  (ZOFRAN -ODT) disintegrating tablet 4 mg (4 mg Oral Given 06/05/24 0155)  oxyCODONE -acetaminophen  (PERCOCET/ROXICET) 5-325 MG per tablet 1 tablet (1 tablet Oral Given 06/05/24 0715)    FINAL CLINICAL IMPRESSION(S) / ED DIAGNOSES   Final diagnoses:  Calculus of gallbladder without cholecystitis without obstruction     Rx / DC Orders   ED Discharge Orders          Ordered    oxyCODONE -acetaminophen  (PERCOCET) 5-325 MG tablet  Every 6 hours PRN        06/05/24 0800    ondansetron  (ZOFRAN -ODT) 4 MG disintegrating tablet  Every 8 hours PRN        06/05/24 0800             Note:  This document was prepared using Dragon voice recognition software and may include unintentional dictation errors.   Suzanne Kirsch, MD 06/05/24 [phone]

## 2024-06-05 NOTE — Discharge Instructions (Signed)
 You are seen in the emergency department for right-sided abdominal pain with nausea and vomiting.  You had findings concerning for gallstones but did not have a gallbladder infection.  You are given information to follow-up as an outpatient with general surgery to discuss electively removing her gallbladder.  You are given information for gallbladder eating plan.  You are given a prescription for pain medication and nausea medication.  Return to the emergency department if you have ongoing or worsening symptoms or any symptoms of infection.  Pain control:  Ibuprofen  (motrin /aleve/advil ) - You can take 3 tablets (600 mg) every 6 hours as needed for pain/fever.  Acetaminophen  (tylenol ) - You can take 2 extra strength tablets (1000 mg) every 6 hours as needed for pain/fever.  You can alternate these medications or take them together.  Make sure you eat food/drink water when taking these medications.  You were given a prescription for narcotic pain medications.  Take only if in severe pain.  These are very addictive medications.  These medications can make you constipated.  If you need to take more than 1-2 doses, start a stool softner.  If you become constipated, take 1 capfull of MiraLAX, can repeat untill having regular bowel movements.  Keep this medication out of reach of any children. You cannot drive/work while taking this medication.

## 2024-06-05 NOTE — ED Notes (Signed)
 Pt reports vomiting multiple times since receiving medications in triage, pain is no better.

## 2024-06-05 NOTE — ED Triage Notes (Addendum)
 Pt arrives POV ambulatory to triage, gait steady, no acute distress noted c/o RUQ pain that started tonight w/ n/v. Similar pain has happened in past and was told she had gallstones. Pt took half of a Vicodin 2 hr pta, no relief.

## 2024-06-06 ENCOUNTER — Emergency Department: Admitting: Certified Registered"

## 2024-06-06 ENCOUNTER — Inpatient Hospital Stay
Admission: EM | Admit: 2024-06-06 | Discharge: 2024-06-08 | DRG: 418 | Disposition: A | Discharge status: Home / Self Care | Attending: General Surgery | Admitting: General Surgery

## 2024-06-06 ENCOUNTER — Encounter: Payer: Self-pay | Admitting: General Surgery

## 2024-06-06 ENCOUNTER — Other Ambulatory Visit: Payer: Self-pay

## 2024-06-06 ENCOUNTER — Emergency Department

## 2024-06-06 ENCOUNTER — Encounter: Admission: EM | Disposition: A | Payer: Self-pay | Source: Home / Self Care | Attending: General Surgery

## 2024-06-06 DIAGNOSIS — K82A1 Gangrene of gallbladder in cholecystitis: Secondary | ICD-10-CM | POA: Diagnosis present

## 2024-06-06 DIAGNOSIS — E119 Type 2 diabetes mellitus without complications: Secondary | ICD-10-CM | POA: Diagnosis present

## 2024-06-06 DIAGNOSIS — Z79899 Other long term (current) drug therapy: Secondary | ICD-10-CM

## 2024-06-06 DIAGNOSIS — Z98891 History of uterine scar from previous surgery: Secondary | ICD-10-CM

## 2024-06-06 DIAGNOSIS — Z8632 Personal history of gestational diabetes: Secondary | ICD-10-CM

## 2024-06-06 DIAGNOSIS — K8000 Calculus of gallbladder with acute cholecystitis without obstruction: Principal | ICD-10-CM | POA: Diagnosis present

## 2024-06-06 DIAGNOSIS — K810 Acute cholecystitis: Secondary | ICD-10-CM | POA: Diagnosis not present

## 2024-06-06 DIAGNOSIS — Z7984 Long term (current) use of oral hypoglycemic drugs: Secondary | ICD-10-CM

## 2024-06-06 DIAGNOSIS — E66813 Obesity, class 3: Secondary | ICD-10-CM | POA: Diagnosis present

## 2024-06-06 DIAGNOSIS — Z87891 Personal history of nicotine dependence: Secondary | ICD-10-CM

## 2024-06-06 DIAGNOSIS — Z9889 Other specified postprocedural states: Secondary | ICD-10-CM

## 2024-06-06 DIAGNOSIS — Z6841 Body Mass Index (BMI) 40.0 and over, adult: Secondary | ICD-10-CM

## 2024-06-06 HISTORY — PX: INTRAOPERATIVE CHOLANGIOGRAM: SHX5230

## 2024-06-06 HISTORY — PX: CHOLECYSTECTOMY: SHX55

## 2024-06-06 HISTORY — PX: REPAIR, ENTEROCELE: SHX7220

## 2024-06-06 LAB — URINALYSIS, ROUTINE W REFLEX MICROSCOPIC
Bacteria, UA: NONE SEEN
Bilirubin Urine: NEGATIVE
Glucose, UA: 50 mg/dL — AB
Hgb urine dipstick: NEGATIVE
Ketones, ur: NEGATIVE mg/dL
Nitrite: NEGATIVE
Protein, ur: 30 mg/dL — AB
Specific Gravity, Urine: 1.032 — ABNORMAL HIGH (ref 1.005–1.030)
pH: 5 (ref 5.0–8.0)

## 2024-06-06 LAB — COMPREHENSIVE METABOLIC PANEL WITH GFR
ALT: 21 U/L (ref 0–44)
AST: 21 U/L (ref 15–41)
Albumin: 3.9 g/dL (ref 3.5–5.0)
Alkaline Phosphatase: 50 U/L (ref 38–126)
Anion gap: 14 (ref 5–15)
BUN: 14 mg/dL (ref 6–20)
CO2: 25 mmol/L (ref 22–32)
Calcium: 9.2 mg/dL (ref 8.9–10.3)
Chloride: 99 mmol/L (ref 98–111)
Creatinine, Ser: 0.61 mg/dL (ref 0.44–1.00)
GFR, Estimated: >60 mL/min (ref >60)
Glucose, Bld: 156 mg/dL — ABNORMAL HIGH (ref 70–99)
Potassium: 4.2 mmol/L (ref 3.5–5.1)
Sodium: 138 mmol/L (ref 135–145)
Total Bilirubin: 1 mg/dL (ref 0.0–1.2)
Total Protein: 7.8 g/dL (ref 6.5–8.1)

## 2024-06-06 LAB — CBC
HCT: 40.9 % (ref 36.0–46.0)
Hemoglobin: 13.7 g/dL (ref 12.0–15.0)
MCH: 28.5 pg (ref 26.0–34.0)
MCHC: 33.5 g/dL (ref 30.0–36.0)
MCV: 85 fL (ref 80.0–100.0)
Platelets: 370 K/uL (ref 150–400)
RBC: 4.81 MIL/uL (ref 3.87–5.11)
RDW: 11.9 % (ref 11.5–15.5)
WBC: 12.6 K/uL — ABNORMAL HIGH (ref 4.0–10.5)
nRBC: 0 % (ref 0.0–0.2)

## 2024-06-06 LAB — HEMOGLOBIN A1C
Hgb A1c MFr Bld: 6.5 % — ABNORMAL HIGH (ref 4.8–5.6)
Mean Plasma Glucose: 139.85 mg/dL

## 2024-06-06 LAB — GLUCOSE, CAPILLARY
Glucose-Capillary: 194 mg/dL — ABNORMAL HIGH (ref 70–99)
Glucose-Capillary: 203 mg/dL — ABNORMAL HIGH (ref 70–99)

## 2024-06-06 LAB — LIPASE, BLOOD: Lipase: 25 U/L (ref 11–51)

## 2024-06-06 SURGERY — CHOLECYSTECTOMY, ROBOT-ASSISTED, LAPAROSCOPIC, USING INDOCYANINE GREEN FLUORESCENCE IMAGING SYSTEM
Anesthesia: General | Site: Abdomen

## 2024-06-06 MED ORDER — SODIUM CHLORIDE 0.9 % IV SOLN
INTRAVENOUS | Status: AC
  Filled 2024-06-06: qty 2

## 2024-06-06 MED ORDER — ROCURONIUM BROMIDE 100 MG/10ML IV SOLN
INTRAVENOUS | Status: DC | PRN
  Administered 2024-06-06: 10 mg via INTRAVENOUS
  Administered 2024-06-06: 60 mg via INTRAVENOUS
  Administered 2024-06-06: 20 mg via INTRAVENOUS

## 2024-06-06 MED ORDER — ACETAMINOPHEN 10 MG/ML IV SOLN
INTRAVENOUS | Status: AC
  Filled 2024-06-06: qty 100

## 2024-06-06 MED ORDER — FENTANYL CITRATE (PF) 100 MCG/2ML IJ SOLN
INTRAMUSCULAR | Status: DC | PRN
  Administered 2024-06-06 (×2): 50 ug via INTRAVENOUS

## 2024-06-06 MED ORDER — OXYCODONE HCL 5 MG PO TABS
5.0000 mg | ORAL_TABLET | ORAL | Status: DC | PRN
  Administered 2024-06-07 (×2): 10 mg via ORAL
  Filled 2024-06-06 (×2): qty 2

## 2024-06-06 MED ORDER — INDOCYANINE GREEN 25 MG IV SOLR
INTRAVENOUS | Status: AC
Start: 2024-06-06 — End: 2024-06-06
  Filled 2024-06-06: qty 10

## 2024-06-06 MED ORDER — INDOCYANINE GREEN 25 MG IV SOLR
1.2500 mg | Freq: Once | INTRAVENOUS | Status: AC
  Administered 2024-06-06: 1.25 mg via TOPICAL

## 2024-06-06 MED ORDER — MORPHINE SULFATE (PF) 4 MG/ML IV SOLN
4.0000 mg | Freq: Once | INTRAVENOUS | Status: AC
  Administered 2024-06-06: 4 mg via INTRAVENOUS
  Filled 2024-06-06: qty 1

## 2024-06-06 MED ORDER — MIDAZOLAM HCL (PF) 2 MG/2ML IJ SOLN
INTRAMUSCULAR | Status: DC | PRN
  Administered 2024-06-06: 2 mg via INTRAVENOUS

## 2024-06-06 MED ORDER — DROPERIDOL 2.5 MG/ML IJ SOLN: 0.6250 mg | Freq: Once | INTRAMUSCULAR | Status: DC | PRN

## 2024-06-06 MED ORDER — MORPHINE SULFATE (PF) 2 MG/ML IV SOLN
2.0000 mg | INTRAVENOUS | Status: DC | PRN
  Administered 2024-06-06: 2 mg via INTRAVENOUS
  Filled 2024-06-06: qty 1

## 2024-06-06 MED ORDER — ACETAMINOPHEN 10 MG/ML IV SOLN
INTRAVENOUS | Status: DC | PRN
  Administered 2024-06-06: 1000 mg via INTRAVENOUS

## 2024-06-06 MED ORDER — ONDANSETRON HCL 4 MG/2ML IJ SOLN: 4.0000 mg | Freq: Four times a day (QID) | INTRAMUSCULAR | Status: DC | PRN

## 2024-06-06 MED ORDER — LACTATED RINGERS IV SOLN: INTRAVENOUS | Status: DC | PRN

## 2024-06-06 MED ORDER — SODIUM CHLORIDE (PF) 0.9 % IJ SOLN
INTRAMUSCULAR | Status: DC | PRN
  Administered 2024-06-06: 50 mL via INTRAVENOUS

## 2024-06-06 MED ORDER — ACETAMINOPHEN 500 MG PO TABS
1000.0000 mg | ORAL_TABLET | Freq: Three times a day (TID) | ORAL | Status: DC
  Administered 2024-06-06 – 2024-06-08 (×5): 1000 mg via ORAL
  Filled 2024-06-06 (×5): qty 2

## 2024-06-06 MED ORDER — INSULIN ASPART 100 UNIT/ML IJ SOLN
0.0000 [IU] | Freq: Three times a day (TID) | INTRAMUSCULAR | Status: DC
  Administered 2024-06-06 – 2024-06-07 (×2): 3 [IU] via SUBCUTANEOUS
  Administered 2024-06-07: 2 [IU] via SUBCUTANEOUS
  Filled 2024-06-06 (×3): qty 1

## 2024-06-06 MED ORDER — PROPOFOL 10 MG/ML IV BOLUS
INTRAVENOUS | Status: DC | PRN
  Administered 2024-06-06: 200 mg via INTRAVENOUS

## 2024-06-06 MED ORDER — DEXAMETHASONE SOD PHOSPHATE PF 10 MG/ML IJ SOLN
INTRAMUSCULAR | Status: DC | PRN
  Administered 2024-06-06: 10 mg via INTRAVENOUS

## 2024-06-06 MED ORDER — BUPIVACAINE-EPINEPHRINE (PF) 0.5% -1:200000 IJ SOLN
INTRAMUSCULAR | Status: DC | PRN
  Administered 2024-06-06: 30 mL via PERINEURAL

## 2024-06-06 MED ORDER — HYDROMORPHONE HCL 1 MG/ML IJ SOLN
INTRAMUSCULAR | Status: DC | PRN
  Administered 2024-06-06: 1 mg via INTRAVENOUS

## 2024-06-06 MED ORDER — SUGAMMADEX SODIUM 500 MG/5ML IV SOLN
INTRAVENOUS | Status: DC | PRN
  Administered 2024-06-06: 400 mg via INTRAVENOUS

## 2024-06-06 MED ORDER — FENTANYL CITRATE (PF) 100 MCG/2ML IJ SOLN
INTRAMUSCULAR | Status: AC
  Filled 2024-06-06: qty 2

## 2024-06-06 MED ORDER — KETAMINE HCL 50 MG/5ML IJ SOSY
PREFILLED_SYRINGE | INTRAMUSCULAR | Status: AC
  Filled 2024-06-06: qty 5

## 2024-06-06 MED ORDER — HYDROMORPHONE HCL 1 MG/ML IJ SOLN
INTRAMUSCULAR | Status: AC
  Filled 2024-06-06: qty 1

## 2024-06-06 MED ORDER — SODIUM CHLORIDE 0.9 % IV SOLN
1.0000 g | Freq: Three times a day (TID) | INTRAVENOUS | Status: DC
  Administered 2024-06-06 – 2024-06-08 (×5): 1 g via INTRAVENOUS
  Filled 2024-06-06 (×7): qty 1

## 2024-06-06 MED ORDER — SODIUM CHLORIDE 0.9 % IV SOLN
2.0000 g | Freq: Once | INTRAVENOUS | Status: AC
  Administered 2024-06-06: 2 g via INTRAVENOUS

## 2024-06-06 MED ORDER — 0.9 % SODIUM CHLORIDE (POUR BTL) OPTIME
TOPICAL | Status: DC | PRN
Start: 2024-06-06 — End: 2024-06-06
  Administered 2024-06-06: 500 mL

## 2024-06-06 MED ORDER — LIDOCAINE HCL (CARDIAC) PF 100 MG/5ML IV SOSY
PREFILLED_SYRINGE | INTRAVENOUS | Status: DC | PRN
  Administered 2024-06-06: 100 mg via INTRAVENOUS

## 2024-06-06 MED ORDER — MIDAZOLAM HCL 2 MG/2ML IJ SOLN
INTRAMUSCULAR | Status: AC
  Filled 2024-06-06: qty 2

## 2024-06-06 MED ORDER — ONDANSETRON 4 MG PO TBDP: 4.0000 mg | ORAL_TABLET | Freq: Four times a day (QID) | ORAL | Status: DC | PRN

## 2024-06-06 MED ORDER — PROPOFOL 10 MG/ML IV BOLUS
INTRAVENOUS | Status: AC
  Filled 2024-06-06: qty 20

## 2024-06-06 MED ORDER — ONDANSETRON HCL 4 MG/2ML IJ SOLN
4.0000 mg | Freq: Once | INTRAMUSCULAR | Status: AC
  Administered 2024-06-06: 4 mg via INTRAVENOUS
  Filled 2024-06-06: qty 2

## 2024-06-06 MED ORDER — FENTANYL CITRATE (PF) 100 MCG/2ML IJ SOLN: 25.0000 ug | INTRAMUSCULAR | Status: DC | PRN

## 2024-06-06 MED ORDER — KETAMINE HCL 50 MG/5ML IJ SOSY
PREFILLED_SYRINGE | INTRAMUSCULAR | Status: DC | PRN
  Administered 2024-06-06: 30 mg via INTRAVENOUS

## 2024-06-06 MED ORDER — ONDANSETRON HCL 4 MG/2ML IJ SOLN
INTRAMUSCULAR | Status: DC | PRN
  Administered 2024-06-06: 4 mg via INTRAVENOUS

## 2024-06-06 MED ORDER — HEMOSTATIC AGENTS (NO CHARGE) OPTIME
TOPICAL | Status: DC | PRN
  Administered 2024-06-06: 1 via TOPICAL

## 2024-06-06 MED ORDER — DEXMEDETOMIDINE HCL IN NACL 80 MCG/20ML IV SOLN
INTRAVENOUS | Status: DC | PRN
  Administered 2024-06-06 (×2): 12 ug via INTRAVENOUS
  Administered 2024-06-06 (×2): 8 ug via INTRAVENOUS

## 2024-06-06 MED ORDER — SODIUM CHLORIDE 0.9 % IV BOLUS
1000.0000 mL | Freq: Once | INTRAVENOUS | Status: AC
  Administered 2024-06-06: 1000 mL via INTRAVENOUS

## 2024-06-06 SURGICAL SUPPLY — 50 items
BAG PRESSURE INF REUSE 1000 (BAG) IMPLANT
CANNULA REDUCER 12-8 DVNC XI (CANNULA) ×2 IMPLANT
CATH CHOLANG 76X19 KUMAR (CATHETERS) IMPLANT
CAUTERY HOOK MNPLR 1.6 DVNC XI (INSTRUMENTS) ×2 IMPLANT
CLIP LIGATING HEMO O LOK GREEN (MISCELLANEOUS) ×2 IMPLANT
DEFOGGER SCOPE WARM SEASHARP (MISCELLANEOUS) ×2 IMPLANT
DERMABOND ADVANCED .7 DNX12 (GAUZE/BANDAGES/DRESSINGS) ×2 IMPLANT
DERMABOND ADVANCED .7 DNX6 (GAUZE/BANDAGES/DRESSINGS) IMPLANT
DRAIN CHANNEL JP 19F RND 3/16 (MISCELLANEOUS) IMPLANT
DRAPE ARM DVNC X/XI (DISPOSABLE) ×8 IMPLANT
DRAPE C-ARM XRAY 36X54 (DRAPES) IMPLANT
DRAPE COLUMN DVNC XI (DISPOSABLE) ×2 IMPLANT
DRSG TEGADERM 4X4.75 (GAUZE/BANDAGES/DRESSINGS) IMPLANT
ELECTRODE REM PT RTRN 9FT ADLT (ELECTROSURGICAL) ×2 IMPLANT
EVACUATOR SILICONE 100CC (DRAIN) IMPLANT
FORCEPS BPLR 8 MD DVNC XI (FORCEP) IMPLANT
FORCEPS BPLR R/ABLATION 8 DVNC (INSTRUMENTS) ×2 IMPLANT
FORCEPS PROGRASP DVNC XI (FORCEP) ×2 IMPLANT
GLOVE BIOGEL PI IND STRL 7.5 (GLOVE) ×4 IMPLANT
GLOVE SURG SYN 7.0 PF PI (GLOVE) ×4 IMPLANT
GOWN STRL REUS W/ TWL LRG LVL3 (GOWN DISPOSABLE) ×6 IMPLANT
GRASPER SUT TROCAR 14GX15 (MISCELLANEOUS) ×2 IMPLANT
IRRIGATOR SUCT 8 DISP DVNC XI (IRRIGATION / IRRIGATOR) IMPLANT
IV 0.9% NACL 1000 ML (IV SOLUTION) IMPLANT
KIT PINK PAD W/HEAD ARM REST (MISCELLANEOUS) ×2 IMPLANT
LABEL OR SOLS (LABEL) ×2 IMPLANT
MANIFOLD NEPTUNE II (INSTRUMENTS) ×2 IMPLANT
NDL HYPO 22X1.5 SAFETY MO (MISCELLANEOUS) ×2 IMPLANT
NDL INSUFFLATION 14GA 120MM (NEEDLE) ×2 IMPLANT
NEEDLE HYPO 22X1.5 SAFETY MO (MISCELLANEOUS) ×2 IMPLANT
NEEDLE INSUFFLATION 14GA 120MM (NEEDLE) ×2 IMPLANT
NS IRRIG 500ML POUR BTL (IV SOLUTION) ×2 IMPLANT
OBTURATOR OPTICALSTD 8 DVNC (TROCAR) ×2 IMPLANT
PACK LAP CHOLECYSTECTOMY (MISCELLANEOUS) ×2 IMPLANT
POWDER SURGICEL 3.0 GRAM (HEMOSTASIS) IMPLANT
SEAL UNIV 5-12 XI (MISCELLANEOUS) ×8 IMPLANT
SET TUBE SMOKE EVAC HIGH FLOW (TUBING) ×2 IMPLANT
SOLUTION ELECTROSURG ANTI STCK (MISCELLANEOUS) ×2 IMPLANT
SPIKE FLUID TRANSFER (MISCELLANEOUS) ×4 IMPLANT
SPONGE DRAIN TRACH 4X4 STRL 2S (GAUZE/BANDAGES/DRESSINGS) IMPLANT
SPONGE T-LAP 4X18 ~~LOC~~+RFID (SPONGE) IMPLANT
STOPCOCK 3WAY MALE LL (IV SETS) IMPLANT
SUT VICRYL 0 UR6 27IN ABS (SUTURE) ×2 IMPLANT
SUTURE EHLN 3-0 FS-10 30 BLK (SUTURE) IMPLANT
SUTURE MNCRL 4-0 27XMF (SUTURE) ×2 IMPLANT
SYR 20ML LL LF (SYRINGE) IMPLANT
SYSTEM BAG RETRIEVAL 10MM (BASKET) ×2 IMPLANT
SYSTEM WECK SHIELD CLOSURE (TROCAR) IMPLANT
TIP ENDOSCOPIC SURGICEL (TIP) IMPLANT
WATER STERILE IRR 500ML POUR (IV SOLUTION) ×2 IMPLANT

## 2024-06-06 NOTE — ED Notes (Signed)
 Pt changed into gown. Belongings placed in pt belonging bag and with pt at bedside.

## 2024-06-06 NOTE — ED Notes (Signed)
 Verbal consent for MSE provided by patient.

## 2024-06-06 NOTE — Transfer of Care (Signed)
 Immediate Anesthesia Transfer of Care Note  Patient: [name]  Procedure(s) Performed: CHOLECYSTECTOMY, ROBOT-ASSISTED, LAPAROSCOPIC, USING INDOCYANINE Hermes Wafer FLUORESCENCE IMAGING SYSTEM (Abdomen) CHOLANGIOGRAM, INTRAOPERATIVE  Patient Location: PACU  Anesthesia Type:General  Level of Consciousness: awake, alert , and oriented  Airway & Oxygen Therapy: Patient Spontanous Breathing and Patient connected to face mask oxygen  Post-op Assessment: Report given to RN, Post -op Vital signs reviewed and stable, and Patient moving all extremities  Post vital signs: Reviewed and stable  Last Vitals:  Vitals Value Taken Time  BP 113/61 06/06/24 14:08  Temp 36.4 C 06/06/24 14:08  Pulse 85 06/06/24 14:15  Resp 17 06/06/24 14:15  SpO2 100 % 06/06/24 14:15  Vitals shown include unfiled device data.  Last Pain:  Vitals:   06/06/24 1408  TempSrc:   PainSc: Asleep         Complications: No notable events documented.

## 2024-06-06 NOTE — ED Notes (Addendum)
 Primary RN made aware pt roomed and still needs urine sample. Pt encouraged to provide urine sample

## 2024-06-06 NOTE — ED Triage Notes (Signed)
 Patient arrive POV with c/o abdominal pain to the right upper quad. Was here Thursday and d/c with same complains however was made aware if the pain did not go away to come back. Pain meds only helped a little bit.

## 2024-06-06 NOTE — Op Note (Signed)
 Robotic assisted laparoscopic Cholecystectomy  Pre-operative Diagnosis: Acute gangrenous cholecystitis  Post-operative Diagnosis: Acute gangrenous cholecystitis  Procedure:  Robotic assisted laparoscopic Cholecystectomy with intraoperative cholangiogram  Surgeon: Jayson Endow, MD  Anesthesia: Gen. with endotracheal tube  Findings: Gangrenous gallbladder, critical view of safety unable to be obtained to cholangiogram performed and cystic duct was confirmed to be the candidate that we clipped and cut.  Estimated Blood Loss: 30cc       Specimens: Gallbladder           Complications: none   Procedure Details  The patient was seen again in the Holding Room. The benefits, complications, treatment options, and expected outcomes were discussed with the patient. The risks of bleeding, infection, recurrence of symptoms, failure to resolve symptoms, bile duct damage, bile duct leak, retained common bile duct stone, bowel injury, any of which could require further surgery and/or ERCP, stent, or papillotomy were reviewed with the patient. The likelihood of improving the patient's symptoms with return to their baseline status is good.  The patient and/or family concurred with the proposed plan, giving informed consent.  The patient was taken to Operating Room, identified  and the procedure verified as robotic Cholecystectomy.  A Time Out was held and the above information confirmed.  Prior to the induction of general anesthesia, antibiotic prophylaxis was administered. VTE prophylaxis was in place. General endotracheal anesthesia was then administered and tolerated well. After the induction, the abdomen was prepped with Chloraprep and draped in the sterile fashion. The patient was positioned in the supine position.  A veress needle was inserted into the abdomen using standard drop technique. An 8mm infra-umbilical robotic port was then placed under direct visualization. There was no injury noted at  the site of veress needle insertion. Two right sided abdominal 8mm ports followed by an 8mm left abdominal robotic ports were placed under direct visualization. The left sided abdominal port was then upsized to a 12mm robotic port.  The patient was positioned  in reverse Trendelenburg, robot was brought to the surgical field and docked in the standard fashion.  We made sure all the instrumentation was kept indirect view at all times and that there were no collision between the arms. I scrubbed out and went to the console.  The gallbladder was identified, the fundus grasped and retracted cephalad. Adhesions were lysed bluntly. The infundibulum was grasped and retracted laterally, exposing the peritoneum overlying the triangle of Calot. This was then divided and exposed in a blunt fashion.  Given the degree of infection there was large amount of inflammatory tissue over the triangle of Coelho.  I was able to create a plane between the infundibulum and what I thought was a cystic duct candidate.  However I was unable to get a true critical view of safety so I elected to perform an intraoperative cholangiogram.  A Kumar clamp was placed across the cystic duct candidate and the catheter was fed into the cystic duct candidate.  The robotic instruments were then removed and the C-arm was brought in.  A cholangiogram was performed.  I independently interpreted the cholangiogram images.  The cystic structure that we had encircled with the Kumar clamp was indeed the cystic duct.  There was good flow into the duodenum and I was able to view both the right and left hepatic ducts.  The robotic platform was then redocked.  The cystic duct was then secured with 3 clips and ligated distally.  The gallbladder was then taken in retrograde fashion off  of the hepatic plate.  There was a small arterial branch that we came across that was likely the cystic duct artery.  This was clipped doubly and hemostasis was ensured.  I did  elect to leave a drain in the right upper quadrant.  A 19 French Blake drain was then placed under direct visualization into the right upper quadrant quadrant and placed under the liver bed. Robotic instruments and robotic arms were undocked in the standard fashion.  I scrubbed back in.  The gallbladder was removed and placed in an Endocatch bag.   The left lower quadrant fascia was then closed with a 0 vicryl.. The pre-peritoneal space was then infiltrated with liposomal bupivicaine and marcaine  solution. Pneumoperitoneum was released.  4-0 subcuticular Monocryl was used to close the skin. Dermabond was  applied.  The patient was then extubated and brought to the recovery room in stable condition. Sponge, lap, and needle counts were correct at closure and at the conclusion of the case.               Jayson Endow, M.D. [facility]

## 2024-06-06 NOTE — Plan of Care (Signed)
  Problem: Clinical Measurements: Goal: Ability to maintain clinical measurements within normal limits will improve Outcome: Progressing   Problem: Clinical Measurements: Goal: Diagnostic test results will improve Outcome: Progressing   Problem: Clinical Measurements: Goal: Will remain free from infection Outcome: Progressing   Problem: Elimination: Goal: Will not experience complications related to bowel motility Outcome: Progressing   Problem: Pain Managment: Goal: General experience of comfort will improve and/or be controlled Outcome: Progressing

## 2024-06-06 NOTE — Anesthesia Procedure Notes (Signed)
 Procedure Name: Intubation Date/Time: 06/06/2024 11:16 AM  Performed by: Landy Francena BIRCH, CRNAPre-anesthesia Checklist: Patient identified, Emergency Drugs available, Suction available and Patient being monitored Patient Re-evaluated:Patient Re-evaluated prior to induction Oxygen Delivery Method: Circle system utilized Preoxygenation: Pre-oxygenation with 100% oxygen Induction Type: IV induction Ventilation: Mask ventilation with difficulty and Oral airway inserted - appropriate to patient size Laryngoscope Size: McGrath and 3 Grade View: Grade I Tube type: Oral Tube size: 7.0 mm Number of attempts: 1 Airway Equipment and Method: Stylet, Oral airway and Bite block Placement Confirmation: ETT inserted through vocal cords under direct vision, positive ETCO2 and breath sounds checked- equal and bilateral Secured at: 22 cm Tube secured with: Tape Dental Injury: Teeth and Oropharynx as per pre-operative assessment

## 2024-06-06 NOTE — ED Provider Notes (Signed)
 [facility] Provider Note    Event Date/Time   First MD Initiated Contact with Patient 06/06/24 [phone]     (approximate)   History   Abdominal Pain   HPI  [name] is a 28 year old female presenting to the emergency department for evaluation of abdominal pain.  Patient reports that for the past few days she has had ongoing right upper quadrant abdominal pain.  Seen in our ER yesterday where she had an ultrasound performed demonstrating large gallstone without evidence of cholecystitis.  Discharged on Percocet, but has had ongoing pain despite that.  No fevers.     Physical Exam   Triage Vital Signs: ED Triage Vitals  Encounter Vitals Group     BP 06/06/24 0639 135/74     Girls Systolic BP Percentile --      Girls Diastolic BP Percentile --      Boys Systolic BP Percentile --      Boys Diastolic BP Percentile --      Pulse Rate 06/06/24 0639 95     Resp 06/06/24 0639 17     Temp 06/06/24 0639 98.3 F (36.8 C)     Temp Source 06/06/24 0639 Oral     SpO2 06/06/24 0636 98 %     Weight 06/06/24 0641 253 lb 14.4 oz (115.2 kg)     Height 06/06/24 0641 5' 4 (1.626 m)     Head Circumference --      Peak Flow --      Pain Score 06/06/24 0640 8     Pain Loc --      Pain Education --      Exclude from Growth Chart --     Most recent vital signs: Vitals:   06/06/24 0721 06/06/24 0757  BP:  111/67  Pulse:  98  Resp:  18  Temp:    SpO2: 100% 100%     General: Awake, interactive  CV:  Good peripheral perfusion Resp:  Unlabored respirations Abd:  Nondistended, soft, focal tenderness to palpation of the right upper quadrant, remainder of abdomen nontender Neuro:  Symmetric facial movement, fluid speech   ED Results / Procedures / Treatments   Labs (all labs ordered are listed, but only abnormal results are displayed) Labs Reviewed  COMPREHENSIVE METABOLIC PANEL WITH GFR - Abnormal; Notable for the following components:       Result Value   Glucose, Bld 156 (*)    All other components within normal limits  CBC - Abnormal; Notable for the following components:   WBC 12.6 (*)    All other components within normal limits  URINALYSIS, ROUTINE W REFLEX MICROSCOPIC - Abnormal; Notable for the following components:   Color, Urine AMBER (*)    APPearance CLOUDY (*)    Specific Gravity, Urine 1.032 (*)    Glucose, UA 50 (*)    Protein, ur 30 (*)    Leukocytes,Ua MODERATE (*)    All other components within normal limits  LIPASE, BLOOD     EKG EKG independently reviewed and interpreted by myself demonstrates:    RADIOLOGY Imaging independently reviewed and interpreted by myself demonstrates:  Ultrasound redemonstrates cholelithiasis with positive sonographic Murphy but no wall thickening noted  Formal Radiology Read:  US  ABDOMEN LIMITED RUQ (LIVER/GB) Result Date: 06/06/2024 CLINICAL DATA:  Right upper quadrant abdominal pain EXAM: ULTRASOUND ABDOMEN LIMITED RIGHT UPPER QUADRANT COMPARISON:  Abdominal ultrasound examination dated 06/05/2024 FINDINGS: Gallbladder: No wall thickening visualized. Shadowing stone at the gallbladder  neck measures 2.3 cm. Positive sonographic Murphy sign noted by sonographer. Common bile duct: Diameter: 6 mm Liver: No focal lesion identified. Within normal limits in parenchymal echogenicity. Portal vein is patent on color Doppler imaging with normal direction of blood flow towards the liver. Other: None. IMPRESSION: Cholelithiasis with positive sonographic Murphy sign. No gallbladder wall thickening or pericholecystic fluid. Findings are equivocal for acute cholecystitis. If there is continued clinical concern, consider further evaluation with nuclear medicine HIDA scan. Electronically Signed   By: Limin  Xu M.D.   On: 06/06/2024 09:15    PROCEDURES:  Critical Care performed: No  Procedures   MEDICATIONS ORDERED IN ED: Medications  indocyanine green (IC-GREEN) injection 1.25 mg  (has no administration in time range)  cefoTEtan (CEFOTAN) 2 g in sodium chloride  0.9 % 100 mL IVPB (has no administration in time range)  morphine  (PF) 4 MG/ML injection 4 mg (4 mg Intravenous Given 06/06/24 0754)  ondansetron  (ZOFRAN ) injection 4 mg (4 mg Intravenous Given 06/06/24 0754)  sodium chloride  0.9 % bolus 1,000 mL (1,000 mLs Intravenous New Bag/Given 06/06/24 0757)     IMPRESSION / MDM / ASSESSMENT AND PLAN / ED COURSE  I reviewed the triage vital signs and the nursing notes.  Differential diagnosis includes, but is not limited to, cholelithiasis, cholecystitis, CBD stone, pancreatitis, gastritis, lower suspicion other acute intra-abdominal process given reassuring abdominal exam  Patient's presentation is most consistent with acute presentation with potential threat to life or bodily function.  28 year old female presenting to the emergency department for ongoing and worsening right upper quadrant pain.  Known gallstones, will obtain repeat ultrasound to evaluate for development of cholecystitis.  Stable vitals.  Labs with mild leukocytosis to [facility] of 12.6, CMP overall reassuring.  Urine overall not suggestive of infection.  Normal lipase.  Ultrasound as below.  Clinical Course as of 06/06/24 1016  Sat Jun 06, 2024  0934 US  ABDOMEN LIMITED RUQ (LIVER/GB) Equivocal ultrasound without gallbladder wall thickening or pericholecystic fluid, but large gallstone with positive Murphy sign noted.  Case reviewed with Dr. Marinda with general surgery. He will evaluate for anticipated admission.  [NR]    Clinical Course User Index [NR] Levander Slate, MD     FINAL CLINICAL IMPRESSION(S) / ED DIAGNOSES   Final diagnoses:  Acute cholecystitis     Rx / DC Orders   ED Discharge Orders     None        Note:  This document was prepared using Dragon voice recognition software and may include unintentional dictation errors.   Levander Slate, MD 06/06/24 1016

## 2024-06-06 NOTE — Anesthesia Preprocedure Evaluation (Signed)
 Anesthesia Evaluation  Patient identified by MRN, date of birth, ID band Patient awake  General Assessment Comment:  Scheduled secondary c/s. Patient found out she was pregnant only last month, no prenatal care before that, did not know she had gestational diabetes.  Reviewed: Allergy & Precautions, NPO status , Patient's Chart, lab work & pertinent test results  History of Anesthesia Complications Negative for: history of anesthetic complications  Airway Mallampati: I  TM Distance: >3 FB Neck ROM: Full    Dental no notable dental hx. (+) Teeth Intact, Dental Advidsory Given   Pulmonary neg shortness of breath, sleep apnea , neg COPD, neg recent URI, Patient abstained from smoking.Not current smoker, former smoker vapes   Pulmonary exam normal breath sounds clear to auscultation       Cardiovascular Exercise Tolerance: Good METS(-) hypertension(-) angina (-) CAD and (-) Past MI negative cardio ROS (-) dysrhythmias  Rhythm:Regular Rate:Normal - Systolic murmurs    Neuro/Psych neg Seizures PSYCHIATRIC DISORDERS Anxiety Depression    negative neurological ROS     GI/Hepatic Neg liver ROS,GERD  Controlled,,(+)     (-) substance abuse    Endo/Other  diabetes, Type 2  Class 3 obesity  Renal/GU negative Renal ROS     Musculoskeletal   Abdominal  (+) + obese  Peds  Hematology   Anesthesia Other Findings Past Medical History: No date: Anxiety No date: Depression No date: Gestational diabetes 2017: Gestational diabetes mellitus (GDM) affecting pregnancy,  antepartum No date: Sleep apnea  Reproductive/Obstetrics negative OB ROS                              Anesthesia Physical Anesthesia Plan  ASA: 3  Anesthesia Plan: General   Post-op Pain Management: Tylenol  PO (pre-op)*, Gabapentin  PO (pre-op)* and Toradol  IV (intra-op)*   Induction: Intravenous  PONV Risk Score and Plan: 4 or  greater and Ondansetron , Dexamethasone , Midazolam and Treatment may vary due to age or medical condition  Airway Management Planned: Oral ETT  Additional Equipment:   Intra-op Plan:   Post-operative Plan: Extubation in OR  Informed Consent: I have reviewed the patients History and Physical, chart, labs and discussed the procedure including the risks, benefits and alternatives for the proposed anesthesia with the patient or authorized representative who has indicated his/her understanding and acceptance.       Plan Discussed with: CRNA and Surgeon  Anesthesia Plan Comments:         Anesthesia Quick Evaluation

## 2024-06-06 NOTE — ED Notes (Signed)
 This RN gave report to OR nurse

## 2024-06-06 NOTE — H&P (Signed)
 Patient ID: [name], female   DOB: 1996/05/09, 28 y.o.   MRN: [id number] CC: Biliary Colic History of Present Illness [name] is a 28 y.o. female with past medical history significant for diabetes who presents in consultation for right upper quadrant pain.  The patient reports that she was in the ED yesterday with right upper quadrant pain and was discharged with pain medications.  However, she reports that the pain persisted so she returned to the ED.  She says that the pain started initially last night.  She had some nausea but denies any episodes of vomiting.  She denies any fevers or chills.  She does report that she had similar pain approximately 4 years ago after eating but this resolved spontaneously and she did not follow-up.  Surgical history significant for 2 C-sections and a tubal ligation performed at the time of her C-section..  Past Medical History Past Medical History:  Diagnosis Date   Anxiety    Depression    Gestational diabetes    Gestational diabetes mellitus (GDM) affecting pregnancy, antepartum 2017   Sleep apnea        Past Surgical History:  Procedure Laterality Date   CESAREAN SECTION N/A 04/26/2016   Procedure: CESAREAN SECTION;  Surgeon: Debby JINNY Dinsmore, MD;  Location: [facility] ORS;  Service: Obstetrics;  Laterality: N/A;   CESAREAN SECTION WITH BILATERAL TUBAL LIGATION Bilateral 09/13/2023   Procedure: REPEAT CESAREAN SECTION WITH BILATERAL TUBAL LIGATION;  Surgeon: Dinsmore Debby JINNY, MD;  Location: [facility] ORS;  Service: Obstetrics;  Laterality: Bilateral;   TONSILLECTOMY      No Known Allergies  No current facility-administered medications for this encounter.   Current Outpatient Medications  Medication Sig Dispense Refill   acetaminophen  (TYLENOL ) 500 MG tablet Take 1 tablet (500 mg total) by mouth every 6 (six) hours as needed.     Blood Glucose Monitoring Suppl DEVI 1 each by Does not apply route in the morning, at noon, and  at bedtime. May substitute to any manufacturer covered by patient's insurance. 1 each 0   ibuprofen  (ADVIL ) 600 MG tablet Take 1 tablet (600 mg total) by mouth every 6 (six) hours as needed.     metFORMIN  (GLUCOPHAGE ) 500 MG tablet Take 1 tablet (500 mg total) by mouth 2 (two) times daily with a meal. 60 tablet 11   ondansetron  (ZOFRAN -ODT) 4 MG disintegrating tablet Take 1 tablet (4 mg total) by mouth every 8 (eight) hours as needed for nausea or vomiting. 30 tablet 0   oxyCODONE  (OXY IR/ROXICODONE ) 5 MG immediate release tablet Take 1 tablet (5 mg total) by mouth every 6 (six) hours as needed for moderate pain (pain score 4-6) or breakthrough pain. 30 tablet 0   oxyCODONE -acetaminophen  (PERCOCET) 5-325 MG tablet Take 1 tablet by mouth every 6 (six) hours as needed for up to 3 days for severe pain (pain score 7-10). 10 tablet 0   Prenatal Vit-Fe Fumarate-FA (MULTIVITAMIN-PRENATAL) 27-0.8 MG TABS tablet Take 1 tablet by mouth daily at 12 noon.      Family History No family history on file.     Social History Social History   Tobacco Use   Smoking status: Former    Types: Cigarettes   Smokeless tobacco: Former  Building Services Engineer status: Former  Substance Use Topics   Alcohol use: No   Drug use: No        ROS Full ROS of systems performed and is otherwise negative there than what is  stated in the HPI  Physical Exam Blood pressure 111/67, pulse 98, temperature 98.3 F (36.8 C), temperature source Oral, resp. rate 18, height 5' 4 (1.626 m), weight 115.2 kg, last menstrual period 05/19/2024, SpO2 100%, not currently breastfeeding.  Alert and oriented x 3, normal work of breathing on room air, regular rate and rhythm, abdomen is soft, tender to palpation in the right upper quadrant without any rebound tenderness or guarding, negative Murphy sign, obese abdomen, mood and affect appropriate  Data Reviewed Labs revealed and significant for leukocytosis and normal total Ruben.   Ultrasound reviewed and there is a large stone within the gallbladder neck.  Equivocal for cholecystitis.  I have personally reviewed the patient's imaging and medical records.    Assessment    Patient with 1 day history of right upper quadrant pain and ultrasound that suggest at the very least biliary colic and possible acute cholecystitis.  Plan    Plan for robotic assisted cholecystectomy with ICG.  I discussed the risk, benefits alternatives of the procedure including risk of infection, bleeding, bile leak, retained stone, injury to common bile duct and need for open procedure.  She understands these risk and wishes to proceed with surgery.    Jayson MALVA Endow 06/06/2024, 9:49 AM

## 2024-06-07 DIAGNOSIS — Z8632 Personal history of gestational diabetes: Secondary | ICD-10-CM | POA: Diagnosis not present

## 2024-06-07 DIAGNOSIS — E119 Type 2 diabetes mellitus without complications: Secondary | ICD-10-CM | POA: Diagnosis present

## 2024-06-07 DIAGNOSIS — K82A1 Gangrene of gallbladder in cholecystitis: Secondary | ICD-10-CM | POA: Diagnosis present

## 2024-06-07 DIAGNOSIS — Z79899 Other long term (current) drug therapy: Secondary | ICD-10-CM | POA: Diagnosis not present

## 2024-06-07 DIAGNOSIS — E66813 Obesity, class 3: Secondary | ICD-10-CM | POA: Diagnosis present

## 2024-06-07 DIAGNOSIS — Z6841 Body Mass Index (BMI) 40.0 and over, adult: Secondary | ICD-10-CM | POA: Diagnosis not present

## 2024-06-07 DIAGNOSIS — Z98891 History of uterine scar from previous surgery: Secondary | ICD-10-CM | POA: Diagnosis not present

## 2024-06-07 DIAGNOSIS — Z87891 Personal history of nicotine dependence: Secondary | ICD-10-CM | POA: Diagnosis not present

## 2024-06-07 DIAGNOSIS — K8000 Calculus of gallbladder with acute cholecystitis without obstruction: Secondary | ICD-10-CM | POA: Diagnosis present

## 2024-06-07 DIAGNOSIS — Z7984 Long term (current) use of oral hypoglycemic drugs: Secondary | ICD-10-CM | POA: Diagnosis not present

## 2024-06-07 DIAGNOSIS — K810 Acute cholecystitis: Secondary | ICD-10-CM | POA: Diagnosis present

## 2024-06-07 LAB — GLUCOSE, CAPILLARY
Glucose-Capillary: 137 mg/dL — ABNORMAL HIGH (ref 70–99)
Glucose-Capillary: 122 mg/dL — ABNORMAL HIGH (ref 70–99)
Glucose-Capillary: 152 mg/dL — ABNORMAL HIGH (ref 70–99)
Glucose-Capillary: 155 mg/dL — ABNORMAL HIGH (ref 70–99)

## 2024-06-07 NOTE — Progress Notes (Signed)
 New drainage around Right side JP drain. Notified Attending

## 2024-06-07 NOTE — Progress Notes (Signed)
 CC: Acute Cholecystitis Subjective: Patient is postop day 1 from robotic assisted cholecystectomy.  She reports having some soreness and pain overnight.  Denies any nausea or vomiting.  Denies any bile from her drain.  Has not been out of bed.  Has only tried sips of clear liquid diet.  Objective: Vital signs in last 24 hours: Temp:  [97 F (36.1 C)-98.8 F (37.1 C)] 98.6 F (37 C) (11/02 0800) Pulse Rate:  [83-101] 83 (11/02 0800) Resp:  [16-20] 16 (11/02 0800) BP: (102-116)/(52-73) 114/66 (11/02 0800) SpO2:  [94 %-100 %] 97 % (11/02 0800) Last BM Date : 06/05/24  Intake/Output from previous day: 11/01 0701 - 11/02 0700 In: 1700 [I.V.:1500; IV Piggyback:200] Out: 135 [Drains:105; Blood:30] Intake/Output this shift: Total I/O In: 200 [P.O.:200] Out: 30 [Drains:30]  Physical exam:  Abdomen is soft, appropriately tender to palpation over incisions.  Incisions are clean dry and intact.  Drain with serosanguineous output and no bile noted.  Lab Results: CBC  Recent Labs    06/05/24 0150 06/06/24 0642  WBC 11.9* 12.6*  HGB 13.6 13.7  HCT 40.2 40.9  PLT 379 370   BMET Recent Labs    06/05/24 0150 06/06/24 0642  NA 138 138  K 4.1 4.2  CL 101 99  CO2 28 25  GLUCOSE 132* 156*  BUN 20 14  CREATININE 0.71 0.61  CALCIUM 9.0 9.2   PT/INR No results for input(s): LABPROT, INR in the last 72 hours. ABG No results for input(s): PHART, HCO3 in the last 72 hours.  Invalid input(s): PCO2, PO2  Studies/Results: DG C-Arm 1-60 Min-No Report Result Date: 06/06/2024 Fluoroscopy was utilized by the requesting physician.  No radiographic interpretation.   US  ABDOMEN LIMITED RUQ (LIVER/GB) Result Date: 06/06/2024 CLINICAL DATA:  Right upper quadrant abdominal pain EXAM: ULTRASOUND ABDOMEN LIMITED RIGHT UPPER QUADRANT COMPARISON:  Abdominal ultrasound examination dated 06/05/2024 FINDINGS: Gallbladder: No wall thickening visualized. Shadowing stone at the gallbladder  neck measures 2.3 cm. Positive sonographic Murphy sign noted by sonographer. Common bile duct: Diameter: 6 mm Liver: No focal lesion identified. Within normal limits in parenchymal echogenicity. Portal vein is patent on color Doppler imaging with normal direction of blood flow towards the liver. Other: None. IMPRESSION: Cholelithiasis with positive sonographic Murphy sign. No gallbladder wall thickening or pericholecystic fluid. Findings are equivocal for acute cholecystitis. If there is continued clinical concern, consider further evaluation with nuclear medicine HIDA scan. Electronically Signed   By: Limin  Xu M.D.   On: 06/06/2024 09:15    Anti-infectives: Anti-infectives (From admission, onward)    Start     Dose/Rate Route Frequency Ordered Stop   06/06/24 2000  cefOXitin (MEFOXIN) 1 g in sodium chloride  0.9 % 100 mL IVPB        1 g 200 mL/hr over 30 Minutes Intravenous Every 8 hours 06/06/24 1454     06/06/24 1000  cefoTEtan (CEFOTAN) 2 g in sodium chloride  0.9 % 100 mL IVPB        2 g 200 mL/hr over 30 Minutes Intravenous  Once 06/06/24 0952 06/06/24 1520       Assessment/Plan:  Patient postop day 1 for robotic assisted cholecystectomy with intraoperative cholangiogram for acute cholecystitis.  Overall doing as expected.  Has not had much to drink so we will continue on clear liquid diet today and if does okay with this can advance to soft diet this afternoon.  Drain has no evidence of bile in it.  Will complete 24 hours of perioperative  antibiotics.  If she continues to do well today possible discharge this afternoon versus tomorrow.  She will go home with drain.  Jayson Endow, M.D. [facility]

## 2024-06-07 NOTE — Plan of Care (Signed)

## 2024-06-08 ENCOUNTER — Encounter: Payer: Self-pay | Admitting: General Surgery

## 2024-06-08 LAB — GLUCOSE, CAPILLARY: Glucose-Capillary: 108 mg/dL — ABNORMAL HIGH (ref 70–99)

## 2024-06-08 MED ORDER — OXYCODONE HCL 5 MG PO TABS: 5.0000 mg | ORAL_TABLET | Freq: Three times a day (TID) | ORAL | 0 refills | Status: DC | PRN

## 2024-06-08 MED ORDER — OXYCODONE HCL 5 MG PO TABS: 5.0000 mg | ORAL_TABLET | Freq: Four times a day (QID) | ORAL | 0 refills | Status: DC | PRN

## 2024-06-08 NOTE — Plan of Care (Signed)
  Problem: Education: Goal: Knowledge of General Education information will improve Description: Including pain rating scale, medication(s)/side effects and non-pharmacologic comfort measures Outcome: Progressing   Problem: Clinical Measurements: Goal: Ability to maintain clinical measurements within normal limits will improve Outcome: Progressing   Problem: Clinical Measurements: Goal: Will remain free from infection Outcome: Progressing   Problem: Nutrition: Goal: Adequate nutrition will be maintained Outcome: Progressing   

## 2024-06-08 NOTE — Discharge Summary (Signed)
 Patient ID: [name] MRN: [id number] DOB/AGE: [birth year] 28 y.o.  Admit date: 06/06/2024 Discharge date: 06/08/2024   Discharge Diagnoses:  Principal Problem:   Status post surgery Active Problems:   Acute cholecystitis   Procedures: Robotic assisted cholecystectomy  Hospital Course:   Patient was admitted with acute cholecystitis.  She underwent a robotic assisted cholecystectomy.  On postop day 0 she was started on a clear liquid diet.  She tolerated this well and on postop day 1 was advance to a soft diet.  However, she wanted to stay another night so was kept in the hospital until postop day 2.  She did have a drain that was kept in.  The drain was serosanguineous and will be continued on discharge.  She should follow-up in 2 Consults: None  Disposition: Discharge disposition: 01-Home or Self Care       Discharge Instructions     Call MD for:  difficulty breathing, headache or visual disturbances   Complete by: As directed    Call MD for:  extreme fatigue   Complete by: As directed    Call MD for:  hives   Complete by: As directed    Call MD for:  persistant dizziness or light-headedness   Complete by: As directed    Call MD for:  persistant nausea and vomiting   Complete by: As directed    Call MD for:  redness, tenderness, or signs of infection (pain, swelling, redness, odor or green/yellow discharge around incision site)   Complete by: As directed    Call MD for:  severe uncontrolled pain   Complete by: As directed    Call MD for:  temperature >100.4   Complete by: As directed    Diet - low sodium heart healthy   Complete by: As directed    Discharge instructions   Complete by: As directed    You have surgical glue over your incisions. This will peel off over the next 3-4 weeks. You may shower on post-operative day one. Please let the warm water run over your incisions and pat dry. Please do not submerge the wounds in water for 2 weeks. No heavy  lifting greater than 10-15 lbs for 4 weeks. You may use tylenol , ibuprofen  and ice over your incisions. Please record drain output. You will see us  in the office in two weeks.   Increase activity slowly   Complete by: As directed       Allergies as of 06/08/2024   No Known Allergies      Medication List     STOP taking these medications    oxyCODONE -acetaminophen  5-325 MG tablet Commonly known as: Percocet       TAKE these medications    acetaminophen  500 MG tablet Commonly known as: TYLENOL  Take 1 tablet (500 mg total) by mouth every 6 (six) hours as needed.   Blood Glucose Monitoring Suppl Devi 1 each by Does not apply route in the morning, at noon, and at bedtime. May substitute to any manufacturer covered by patient's insurance.   ibuprofen  600 MG tablet Commonly known as: ADVIL  Take 1 tablet (600 mg total) by mouth every 6 (six) hours as needed.   metFORMIN  500 MG tablet Commonly known as: GLUCOPHAGE  Take 1 tablet (500 mg total) by mouth 2 (two) times daily with a meal.   multivitamin-prenatal 27-0.8 MG Tabs tablet Take 1 tablet by mouth daily at 12 noon.   ondansetron  4 MG disintegrating tablet Commonly known as: ZOFRAN -ODT Take 1  tablet (4 mg total) by mouth every 8 (eight) hours as needed for nausea or vomiting.   oxyCODONE  5 MG immediate release tablet Commonly known as: Oxy IR/ROXICODONE  Take 1 tablet (5 mg total) by mouth every 6 (six) hours as needed for severe pain (pain score 7-10). What changed: reasons to take this        Follow-up Information     Marinda Jayson KIDD, MD Follow up in 2 week(s).   Specialty: General Surgery Contact information: [address] [place] [address] [phone]                  Jayson Marinda, M.D.

## 2024-06-08 NOTE — TOC CM/SW Note (Signed)
 Transition of Care [facility]) - Inpatient Brief Assessment   Patient Details  Name: [name] MRN: [id number] Date of Birth: 03-27-1996  Transition of Care [facility]) CM/SW Contact:    Corean ONEIDA Haddock, RN Phone Number: 06/08/2024, 9:49 AM   Clinical Narrative:   Transition of Care [facility]) Screening Note   Patient Details  Name: [name] Date of Birth: February 09, 1996   Transition of Care [facility]) CM/SW Contact:    Corean ONEIDA Haddock, RN Phone Number: 06/08/2024, 9:49 AM    Transition of Care Department [facility]) has reviewed patient and no TOC needs have been identified at this time. . If new patient transition needs arise, please place a TOC consult.    Transition of Care Asessment: Insurance and Status: Insurance coverage has been reviewed Patient has primary care physician: Yes     Prior/Current Home Services: No current home services Social Drivers of Health Review: SDOH reviewed no interventions necessary Readmission risk has been reviewed: Yes Transition of care needs: no transition of care needs at this time

## 2024-06-09 LAB — SURGICAL PATHOLOGY

## 2024-06-18 ENCOUNTER — Other Ambulatory Visit: Payer: Self-pay

## 2024-06-18 DIAGNOSIS — T85690A Other mechanical complication of epidural and subdural infusion catheter, initial encounter: Secondary | ICD-10-CM | POA: Insufficient documentation

## 2024-06-18 DIAGNOSIS — Y732 Prosthetic and other implants, materials and accessory gastroenterology and urology devices associated with adverse incidents: Secondary | ICD-10-CM | POA: Diagnosis not present

## 2024-06-18 LAB — COMPREHENSIVE METABOLIC PANEL WITH GFR
ALT: 19 U/L (ref 0–44)
AST: 16 U/L (ref 15–41)
Albumin: 4 g/dL (ref 3.5–5.0)
Alkaline Phosphatase: 93 U/L (ref 38–126)
Anion gap: 10 (ref 5–15)
BUN: 18 mg/dL (ref 6–20)
CO2: 26 mmol/L (ref 22–32)
Calcium: 8.9 mg/dL (ref 8.9–10.3)
Chloride: 100 mmol/L (ref 98–111)
Creatinine, Ser: 0.69 mg/dL (ref 0.44–1.00)
GFR, Estimated: >60 mL/min (ref >60)
Glucose, Bld: 180 mg/dL — ABNORMAL HIGH (ref 70–99)
Potassium: 4.2 mmol/L (ref 3.5–5.1)
Sodium: 136 mmol/L (ref 135–145)
Total Bilirubin: 0.3 mg/dL (ref 0.0–1.2)
Total Protein: 7.2 g/dL (ref 6.5–8.1)

## 2024-06-18 LAB — CBC WITH DIFFERENTIAL/PLATELET
Abs Immature Granulocytes: 0.04 K/uL (ref 0.00–0.07)
Basophils Absolute: 0.1 K/uL (ref 0.0–0.1)
Basophils Relative: 1 %
Eosinophils Absolute: 0.2 K/uL (ref 0.0–0.5)
Eosinophils Relative: 2 %
HCT: 41.8 % (ref 36.0–46.0)
Hemoglobin: 13.6 g/dL (ref 12.0–15.0)
Immature Granulocytes: 0 %
Lymphocytes Relative: 35 %
Lymphs Abs: 4.4 K/uL — ABNORMAL HIGH (ref 0.7–4.0)
MCH: 27.9 pg (ref 26.0–34.0)
MCHC: 32.5 g/dL (ref 30.0–36.0)
MCV: 85.8 fL (ref 80.0–100.0)
Monocytes Absolute: 0.7 K/uL (ref 0.1–1.0)
Monocytes Relative: 6 %
Neutro Abs: 7.2 K/uL (ref 1.7–7.7)
Neutrophils Relative %: 56 %
Platelets: 514 K/uL — ABNORMAL HIGH (ref 150–400)
RBC: 4.87 MIL/uL (ref 3.87–5.11)
RDW: 11.7 % (ref 11.5–15.5)
WBC: 12.7 K/uL — ABNORMAL HIGH (ref 4.0–10.5)
nRBC: 0 % (ref 0.0–0.2)

## 2024-06-18 NOTE — ED Triage Notes (Signed)
 Recent lap chole on 11/1. POV from home C/O purulent drainage from around the JP drain as well as some tightness and pain on the right side of abdomen per patient.

## 2024-06-19 ENCOUNTER — Emergency Department

## 2024-06-19 ENCOUNTER — Emergency Department
Admission: EM | Admit: 2024-06-19 | Discharge: 2024-06-19 | Disposition: A | Discharge status: Home / Self Care | Attending: Emergency Medicine | Admitting: Emergency Medicine

## 2024-06-19 DIAGNOSIS — G8918 Other acute postprocedural pain: Secondary | ICD-10-CM

## 2024-06-19 LAB — URINALYSIS, ROUTINE W REFLEX MICROSCOPIC
Bacteria, UA: NONE SEEN
Bilirubin Urine: NEGATIVE
Glucose, UA: NEGATIVE mg/dL
Ketones, ur: NEGATIVE mg/dL
Leukocytes,Ua: NEGATIVE
Nitrite: NEGATIVE
Protein, ur: NEGATIVE mg/dL
Specific Gravity, Urine: 1.03 (ref 1.005–1.030)
pH: 5 (ref 5.0–8.0)

## 2024-06-19 LAB — LIPASE, BLOOD: Lipase: 48 U/L (ref 11–51)

## 2024-06-19 LAB — POC URINE PREG, ED: Preg Test, Ur: NEGATIVE

## 2024-06-19 MED ORDER — IOHEXOL 350 MG/ML SOLN
100.0000 mL | Freq: Once | INTRAVENOUS | Status: AC | PRN
  Administered 2024-06-19: 100 mL via INTRAVENOUS

## 2024-06-19 NOTE — ED Notes (Signed)
 PT in no acute distress prior to discharge. Discharged instructions reviewed, all questions answered and pt verbalized understanding at this time. Pt has all belongings with them at time of discharge.

## 2024-06-19 NOTE — ED Notes (Signed)
POC pregnancy negative

## 2024-06-19 NOTE — ED Provider Notes (Signed)
 [facility] Provider Note    Event Date/Time   First MD Initiated Contact with Patient 06/19/24 [phone]     (approximate)   History   Post-op Problem   HPI  [name] is a 28 y.o. female   Past medical history of laparoscopic cholecystectomy done by Dr. Marinda 2 weeks ago with JP drain in place here with tightness sensation to the musculature around the JP drain insertion site on the right side of her abdomen as well as ongoing drainage.  Denies fever.  Drainage has been scant since the surgery and ongoing.  There is a small solid stringy substance that came out of the drain yesterday that worries her.  There is some leakage around the insertion site as well.  She denies any other acute medical complaints   External Medical Documents Reviewed: Discharge summary from general surgery from 04/08/2024 documenting her surgical procedure      Physical Exam   Triage Vital Signs: ED Triage Vitals [06/18/24 2248]  Encounter Vitals Group     BP 113/80     Girls Systolic BP Percentile      Girls Diastolic BP Percentile      Boys Systolic BP Percentile      Boys Diastolic BP Percentile      Pulse Rate (!) 104     Resp 18     Temp 98.2 F (36.8 C)     Temp Source Oral     SpO2 96 %     Weight      Height      Head Circumference      Peak Flow      Pain Score 3     Pain Loc      Pain Education      Exclude from Growth Chart     Most recent vital signs: Vitals:   06/18/24 2248 06/19/24 0100  BP: 113/80 117/72  Pulse: (!) 104 93  Resp: 18 18  Temp: 98.2 F (36.8 C)   SpO2: 96% 99%    General: Awake, no distress.  CV:  Good peripheral perfusion.  Resp:  Normal effort.  Abd:  No distention.  Other:  Soft benign nonfocal nonperitoneal abdominal exam.  Drain in place in the right side of the abdomen.  Small amount of clear fluid in the insertion site no significant infectious changes in the skin surrounding.  Drain has some dark  substance, scant as well as a small stringy solid substance inside.  Vital signs normal afebrile.   ED Results / Procedures / Treatments   Labs (all labs ordered are listed, but only abnormal results are displayed) Labs Reviewed  CBC WITH DIFFERENTIAL/PLATELET - Abnormal; Notable for the following components:      Result Value   WBC 12.7 (*)    Platelets 514 (*)    Lymphs Abs 4.4 (*)    All other components within normal limits  COMPREHENSIVE METABOLIC PANEL WITH GFR - Abnormal; Notable for the following components:   Glucose, Bld 180 (*)    All other components within normal limits  URINALYSIS, ROUTINE W REFLEX MICROSCOPIC - Abnormal; Notable for the following components:   Color, Urine YELLOW (*)    APPearance CLEAR (*)    Hgb urine dipstick MODERATE (*)    All other components within normal limits  LIPASE, BLOOD  POC URINE PREG, ED     I ordered and reviewed the above labs they are notable for small leukocytosis otherwise  cell counts electrolytes unremarkable.   RADIOLOGY I independently reviewed and interpreted CT of the abdomen pelvis and see drain in place with no obvious fluid collection I also reviewed radiologist's formal read.   PROCEDURES:  Critical Care performed: No  Procedures   MEDICATIONS ORDERED IN ED: Medications - No data to display   IMPRESSION / MDM / ASSESSMENT AND PLAN / ED COURSE  I reviewed the triage vital signs and the nursing notes.                                Patient's presentation is most consistent with acute presentation with potential threat to life or bodily function.  Differential diagnosis includes, but is not limited to, postsurgical pain, normal postsurgical drainage, postsurgical infection, abscess, stent migration, choledocholithiasis considered but less likely   MDM:  \  \  Ongoing drainage from the JP drain is expected but there is this new substance that concerns the patient as well as some leakage around the  drainage site and worsening pain around the drain site that I think warrants evaluation today with labs that show mild leukocytosis as well as a CT scan to look for stent migration's, abscesses, worsening infection etc.  I considered hospitalization for admission or observation however given stability in the emergency department properly functioning drain and an unremarkable workup including CT imaging, plan will be for discharge at this time with close surgery follow-up.        FINAL CLINICAL IMPRESSION(S) / ED DIAGNOSES   Final diagnoses:  None     Rx / DC Orders   ED Discharge Orders     None        Note:  This document was prepared using Dragon voice recognition software and may include unintentional dictation errors.    Cyrena Mylar, MD 06/19/24 [phone]

## 2024-06-19 NOTE — Discharge Instructions (Signed)
 Fortunately your testing in the emergency department did not show any new infections or blockages that would require hospitalization or repeat surgeries at this time.  Please call Dr. Marinda in the morning for an appointment to reassess the drain, and to discuss when it is appropriate to take the drain out.  Take acetaminophen  650 mg and ibuprofen  400 mg every 6 hours for pain.  Take with food. Thank you for choosing us  for your health care today!  Please see your primary doctor this week for a follow up appointment.   If you have any new, worsening, or unexpected symptoms call your doctor right away or come back to the emergency department for reevaluation.  It was my pleasure to care for you today.   Ginnie EDISON Cyrena, MD

## 2024-06-23 ENCOUNTER — Encounter: Payer: Self-pay | Admitting: General Surgery

## 2024-06-23 ENCOUNTER — Ambulatory Visit: Admitting: General Surgery

## 2024-06-23 VITALS — BP 92/60 | HR 80 | Ht 64.0 in | Wt 250.0 lb

## 2024-06-23 DIAGNOSIS — K810 Acute cholecystitis: Secondary | ICD-10-CM

## 2024-06-23 DIAGNOSIS — Z09 Encounter for follow-up examination after completed treatment for conditions other than malignant neoplasm: Secondary | ICD-10-CM

## 2024-06-23 NOTE — Patient Instructions (Signed)
 Keep a clean dry bandaid over your drain site and change daily until is stops draining. It should stop in 2-3 days.  GENERAL POST-OPERATIVE PATIENT INSTRUCTIONS   WOUND CARE INSTRUCTIONS:  Keep a dry clean dressing on the wound if there is drainage. The initial bandage may be removed after 24 hours.  Once the wound has quit draining you may leave it open to air.  If clothing rubs against the wound or causes irritation and the wound is not draining you may cover it with a dry dressing during the daytime.  Try to keep the wound dry and avoid ointments on the wound unless directed to do so.  If the wound becomes bright red and painful or starts to drain infected material that is not clear, please contact your physician immediately.  If the wound is mildly pink and has a thick firm ridge underneath it, this is normal, and is referred to as a healing ridge.  This will resolve over the next 4-6 weeks.  BATHING: You may shower if you have been informed of this by your surgeon. However, Please do not submerge in a tub, hot tub, or pool until incisions are completely sealed or have been told by your surgeon that you may do so.  DIET:  You may eat any foods that you can tolerate.  It is a good idea to eat a high fiber diet and take in plenty of fluids to prevent constipation.  If you do become constipated you may want to take a mild laxative or take ducolax tablets on a daily basis until your bowel habits are regular.  Constipation can be very uncomfortable, along with straining, after recent surgery.  ACTIVITY:  You are encouraged to cough and deep breath or use your incentive spirometer if you were given one, every 15-30 minutes when awake.  This will help prevent respiratory complications and low grade fevers post-operatively if you had a general anesthetic.  You may want to hug a pillow when coughing and sneezing to add additional support to the surgical area, if you had abdominal or chest surgery, which will  decrease pain during these times.  You are encouraged to walk and engage in light activity for the next two weeks.  You should not lift more than 20 pounds for 6 weeks total after surgery as it could put you at increased risk for complications.  Twenty pounds is roughly equivalent to a plastic bag of groceries. At that time- Listen to your body when lifting, if you have pain when lifting, stop and then try again in a few days. Soreness after doing exercises or activities of daily living is normal as you get back in to your normal routine.  MEDICATIONS:  Try to take narcotic medications and anti-inflammatory medications, such as tylenol , ibuprofen , naprosyn, etc., with food.  This will minimize stomach upset from the medication.  Should you develop nausea and vomiting from the pain medication, or develop a rash, please discontinue the medication and contact your physician.  You should not drive, make important decisions, or operate machinery when taking narcotic pain medication.  SUNBLOCK Use sun block to incision area over the next year if this area will be exposed to sun. This helps decrease scarring and will allow you avoid a permanent darkened area over your incision.  QUESTIONS:  Please feel free to call our office if you have any questions, and we will be glad to assist you. [phone]

## 2024-06-23 NOTE — Progress Notes (Signed)
 [name] returns today status post robotic assisted cholecystectomy with intraoperative cholangiogram.  She did go to the emergency department due to pain at her drain site and diminished output in her drain.  She had a CT scan that showed some fluid as expected in the gallbladder fossa.  She reports that she continues to have right sided abdominal pain.  She reports that she is tolerating a diet and having normal bowel function.  She denies any fevers or chills.  She denies any drainage from her abdominal incisions but does have a small amount of drainage from around the drain.  Her drain has been serous and output.  On exam her abdomen is soft, some tenderness in the right abdomen where her drain is coming out without any rebound tenderness or guarding.  Her incisions are scabbing over with some surgical glue still on them.  There is no surrounding erythema.  Her drain is serous.  Her drain was removed in clinic today and she reports that she feels better.  A Band-Aid was placed over this drain.  Pathology reviewed with her and consistent with acute on chronic cholecystitis.  Recommended continue lifting restrictions for another 2 weeks.  She can now submerge the wounds in water.  She can continue on a regular diet.  She can follow-up with us  as needed

## 2024-06-27 NOTE — Anesthesia Postprocedure Evaluation (Signed)
 Anesthesia Post Note  Patient: [name]  Procedure(s) Performed: CHOLECYSTECTOMY, ROBOT-ASSISTED, LAPAROSCOPIC, USING INDOCYANINE GREEN  FLUORESCENCE IMAGING SYSTEM (Abdomen) CHOLANGIOGRAM, INTRAOPERATIVE  Patient location during evaluation: PACU Anesthesia Type: General Level of consciousness: awake and alert Pain management: pain level controlled Vital Signs Assessment: post-procedure vital signs reviewed and stable Respiratory status: spontaneous breathing, nonlabored ventilation, respiratory function stable and patient connected to nasal cannula oxygen Cardiovascular status: blood pressure returned to baseline and stable Postop Assessment: no apparent nausea or vomiting Anesthetic complications: no   No notable events documented.   Last Vitals:  Vitals:   06/08/24 0314 06/08/24 0821  BP: (!) 110/59 (!) 99/55  Pulse: 96 89  Resp: 17 18  Temp: 37.1 C 37.1 C  SpO2: 94% 93%    Last Pain:  Vitals:   06/08/24 0318  TempSrc:   PainSc: 0-No pain                 Prentice Murphy

## 2024-08-11 ENCOUNTER — Encounter: Payer: Self-pay | Admitting: General Surgery
